# Patient Record
Sex: Female | Born: 1950 | Race: White | Hispanic: No | Marital: Single | Smoking: Former smoker
Health system: Southern US, Community
[De-identification: ages and names within clinical notes are randomized; demographics above are authoritative.]

## PROBLEM LIST (undated history)

## (undated) DIAGNOSIS — S81801A Unspecified open wound, right lower leg, initial encounter: Secondary | ICD-10-CM

## (undated) DIAGNOSIS — M1990 Unspecified osteoarthritis, unspecified site: Secondary | ICD-10-CM

## (undated) DIAGNOSIS — F32A Depression, unspecified: Secondary | ICD-10-CM

## (undated) DIAGNOSIS — I499 Cardiac arrhythmia, unspecified: Secondary | ICD-10-CM

## (undated) DIAGNOSIS — Z9889 Other specified postprocedural states: Secondary | ICD-10-CM

## (undated) DIAGNOSIS — F329 Major depressive disorder, single episode, unspecified: Secondary | ICD-10-CM

## (undated) DIAGNOSIS — F319 Bipolar disorder, unspecified: Secondary | ICD-10-CM

## (undated) DIAGNOSIS — I10 Essential (primary) hypertension: Secondary | ICD-10-CM

## (undated) DIAGNOSIS — I209 Angina pectoris, unspecified: Secondary | ICD-10-CM

## (undated) DIAGNOSIS — N189 Chronic kidney disease, unspecified: Secondary | ICD-10-CM

## (undated) DIAGNOSIS — M4807 Spinal stenosis, lumbosacral region: Secondary | ICD-10-CM

## (undated) DIAGNOSIS — K5790 Diverticulosis of intestine, part unspecified, without perforation or abscess without bleeding: Secondary | ICD-10-CM

## (undated) DIAGNOSIS — Z974 Presence of external hearing-aid: Secondary | ICD-10-CM

## (undated) DIAGNOSIS — M797 Fibromyalgia: Secondary | ICD-10-CM

## (undated) DIAGNOSIS — R011 Cardiac murmur, unspecified: Secondary | ICD-10-CM

## (undated) DIAGNOSIS — E7800 Pure hypercholesterolemia, unspecified: Secondary | ICD-10-CM

## (undated) DIAGNOSIS — C801 Malignant (primary) neoplasm, unspecified: Secondary | ICD-10-CM

## (undated) DIAGNOSIS — Z8614 Personal history of Methicillin resistant Staphylococcus aureus infection: Secondary | ICD-10-CM

## (undated) DIAGNOSIS — Z87440 Personal history of urinary (tract) infections: Secondary | ICD-10-CM

## (undated) HISTORY — PX: JOINT REPLACEMENT: SHX530

## (undated) HISTORY — PX: COLONOSCOPY: SHX174

## (undated) HISTORY — PX: TONSILLECTOMY: SUR1361

## (undated) HISTORY — PX: LASIK: SHX215

---
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
## 1987-07-04 HISTORY — PX: FINGER SURGERY: SHX640

## 1988-07-03 HISTORY — PX: RHINOPLASTY: SUR1284

## 1996-07-03 HISTORY — PX: ABLATION: SHX5711

## 2000-07-03 HISTORY — PX: REPLACEMENT TOTAL KNEE BILATERAL: SUR1225

## 2002-07-03 HISTORY — PX: CERVICAL LAMINECTOMY: SHX94

## 2006-12-11 ENCOUNTER — Emergency Department: Payer: Self-pay | Admitting: Emergency Medicine

## 2009-07-03 DIAGNOSIS — Z8614 Personal history of Methicillin resistant Staphylococcus aureus infection: Secondary | ICD-10-CM

## 2009-07-03 HISTORY — DX: Personal history of Methicillin resistant Staphylococcus aureus infection: Z86.14

## 2010-07-03 DIAGNOSIS — N189 Chronic kidney disease, unspecified: Secondary | ICD-10-CM

## 2010-07-03 HISTORY — DX: Chronic kidney disease, unspecified: N18.9

## 2010-11-01 DIAGNOSIS — Z9889 Other specified postprocedural states: Secondary | ICD-10-CM

## 2010-11-01 HISTORY — DX: Other specified postprocedural states: Z98.890

## 2010-11-01 HISTORY — PX: ROTATOR CUFF REPAIR: SHX139

## 2010-11-07 ENCOUNTER — Ambulatory Visit: Payer: Self-pay | Admitting: Specialist

## 2010-11-23 ENCOUNTER — Ambulatory Visit: Payer: Self-pay | Admitting: Specialist

## 2010-11-30 ENCOUNTER — Ambulatory Visit: Payer: Self-pay | Admitting: Specialist

## 2011-04-21 ENCOUNTER — Emergency Department: Payer: Self-pay | Admitting: Emergency Medicine

## 2011-04-24 ENCOUNTER — Inpatient Hospital Stay: Payer: Self-pay | Admitting: Internal Medicine

## 2011-05-14 ENCOUNTER — Other Ambulatory Visit: Payer: Self-pay | Admitting: Family Medicine

## 2011-09-06 ENCOUNTER — Inpatient Hospital Stay: Payer: Self-pay | Admitting: Internal Medicine

## 2011-09-06 LAB — URINALYSIS, COMPLETE
Bacteria: NONE SEEN
Blood: NEGATIVE
Glucose,UR: NEGATIVE mg/dL (ref 0–75)
Leukocyte Esterase: NEGATIVE
Nitrite: NEGATIVE
Protein: NEGATIVE
RBC,UR: NONE SEEN /HPF (ref 0–5)
Transitional Epi: <1
WBC UR: NONE SEEN /HPF (ref 0–5)

## 2011-09-06 LAB — CBC
MCHC: 34.1 g/dL (ref 32.0–36.0)
MCV: 86 fL (ref 80–100)
RBC: 3.3 10*6/uL — ABNORMAL LOW (ref 3.80–5.20)
RDW: 14.5 % (ref 11.5–14.5)
WBC: 6.6 10*3/uL (ref 3.6–11.0)

## 2011-09-06 LAB — APTT: Activated PTT: 31 secs (ref 23.6–35.9)

## 2011-09-06 LAB — COMPREHENSIVE METABOLIC PANEL
Albumin: 4 g/dL (ref 3.4–5.0)
Alkaline Phosphatase: 74 U/L (ref 50–136)
Anion Gap: 11 (ref 7–16)
Calcium, Total: 9.5 mg/dL (ref 8.5–10.1)
EGFR (Non-African Amer.): >60
Glucose: 202 mg/dL — ABNORMAL HIGH (ref 65–99)
SGOT(AST): 13 U/L — ABNORMAL LOW (ref 15–37)
SGPT (ALT): 20 U/L

## 2011-09-06 LAB — PROTIME-INR: Prothrombin Time: 13.2 secs (ref 11.5–14.7)

## 2011-09-07 LAB — BASIC METABOLIC PANEL
Anion Gap: 7 (ref 7–16)
Calcium, Total: 8.2 mg/dL — ABNORMAL LOW (ref 8.5–10.1)
Chloride: 113 mmol/L — ABNORMAL HIGH (ref 98–107)
Co2: 24 mmol/L (ref 21–32)
Creatinine: 1.05 mg/dL (ref 0.60–1.30)
Potassium: 3.8 mmol/L (ref 3.5–5.1)
Sodium: 144 mmol/L (ref 136–145)

## 2011-09-07 LAB — CBC WITH DIFFERENTIAL/PLATELET
Basophil #: 0 10*3/uL (ref 0.0–0.1)
Eosinophil %: 1.4 %
HCT: 23.1 % — ABNORMAL LOW (ref 35.0–47.0)
Lymphocyte %: 42.4 %
MCH: 28.8 pg (ref 26.0–34.0)
MCHC: 33.1 g/dL (ref 32.0–36.0)
MCV: 87 fL (ref 80–100)
Monocyte %: 6 %
Neutrophil #: 2.3 10*3/uL (ref 1.4–6.5)
RBC: 2.66 10*6/uL — ABNORMAL LOW (ref 3.80–5.20)
RDW: 15.1 % — ABNORMAL HIGH (ref 11.5–14.5)

## 2011-09-07 LAB — HEMOGLOBIN A1C: Hemoglobin A1C: 5.2 % (ref 4.2–6.3)

## 2011-09-07 LAB — HEMOGLOBIN
HGB: 7.8 g/dL — ABNORMAL LOW (ref 12.0–16.0)
HGB: 9 g/dL — ABNORMAL LOW (ref 12.0–16.0)

## 2011-10-29 ENCOUNTER — Emergency Department: Payer: Self-pay | Admitting: Emergency Medicine

## 2011-10-29 LAB — COMPREHENSIVE METABOLIC PANEL
Albumin: 4.3 g/dL (ref 3.4–5.0)
Anion Gap: 7 (ref 7–16)
Bilirubin,Total: 0.3 mg/dL (ref 0.2–1.0)
Chloride: 107 mmol/L (ref 98–107)
Creatinine: 1.23 mg/dL (ref 0.60–1.30)
EGFR (African American): 54 — ABNORMAL LOW
EGFR (Non-African Amer.): 47 — ABNORMAL LOW
Glucose: 100 mg/dL — ABNORMAL HIGH (ref 65–99)
Potassium: 4.1 mmol/L (ref 3.5–5.1)
SGOT(AST): 16 U/L (ref 15–37)
SGPT (ALT): 19 U/L

## 2011-10-29 LAB — CBC
MCH: 24.6 pg — ABNORMAL LOW (ref 26.0–34.0)
MCV: 77 fL — ABNORMAL LOW (ref 80–100)
Platelet: 324 10*3/uL (ref 150–440)
RBC: 4.37 10*6/uL (ref 3.80–5.20)
RDW: 18.9 % — ABNORMAL HIGH (ref 11.5–14.5)

## 2012-06-06 ENCOUNTER — Ambulatory Visit: Payer: Self-pay | Admitting: Gastroenterology

## 2012-07-03 HISTORY — PX: GASTRIC BYPASS: SHX52

## 2012-10-28 HISTORY — PX: ESOPHAGOGASTRODUODENOSCOPY: SHX1529

## 2013-01-22 IMAGING — US US RENAL KIDNEY
1 series · 18 of 25 positions shown · non-contrast
Comparison: none

REASON FOR EXAM: renal failure
COMMENTS:

PROCEDURE:     US  - US KIDNEY  - April 24, 2011  [DATE]
RESULT:     Right kidney measures 10.9 cm in maximum diameter, the left
cm. Cortex appears normal. No hydronephrosis. Bilateral renal flow present.
Foley in bladder.

[Series 1: us renal kidney · 18 of 25 slices shown]
[im 1/25]
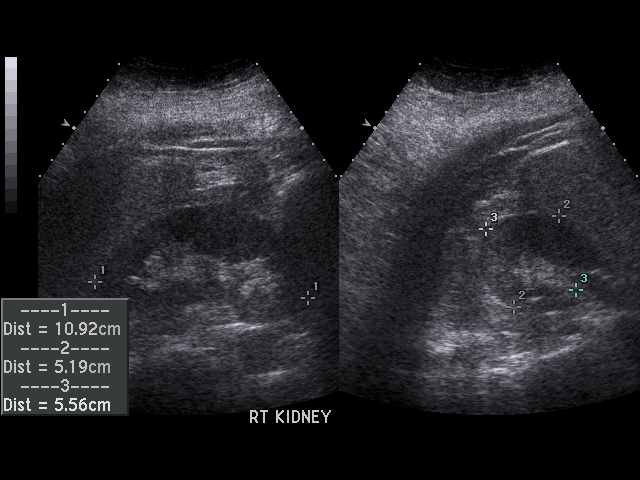
[im 3/25]
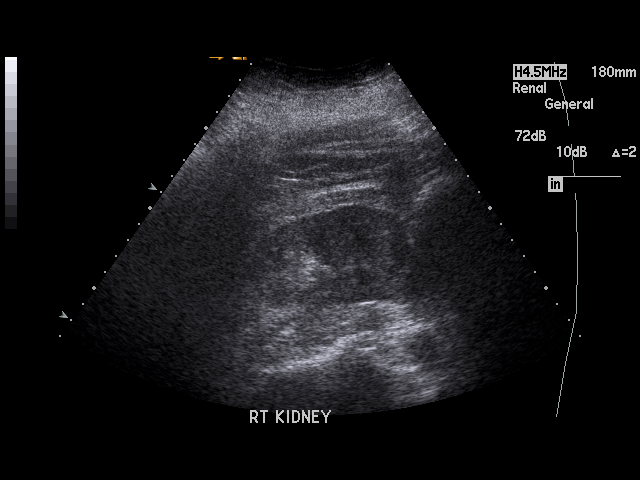
[im 4/25]
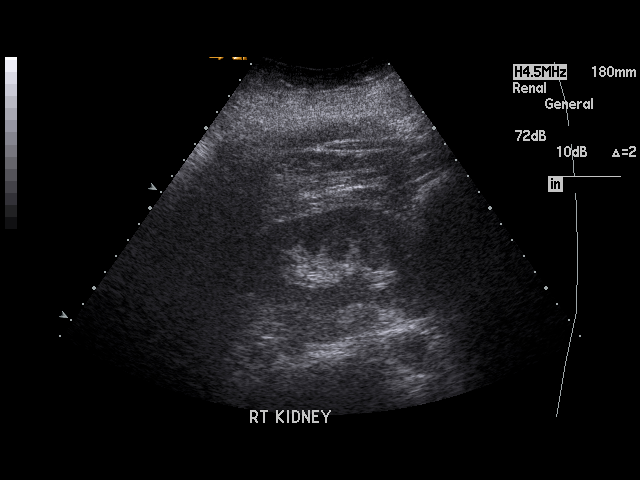
[im 5/25]
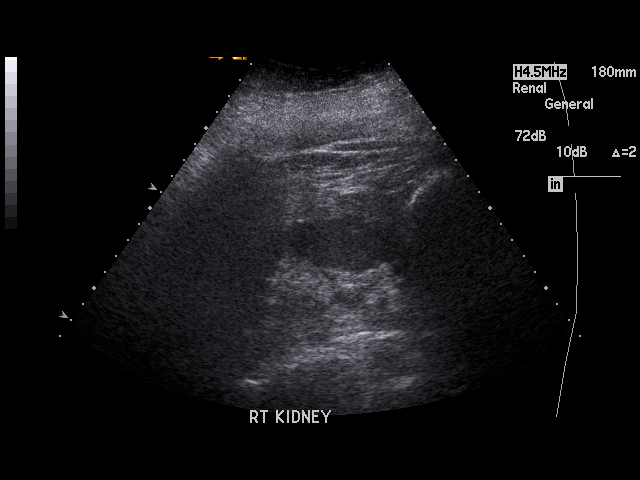
[im 7/25]
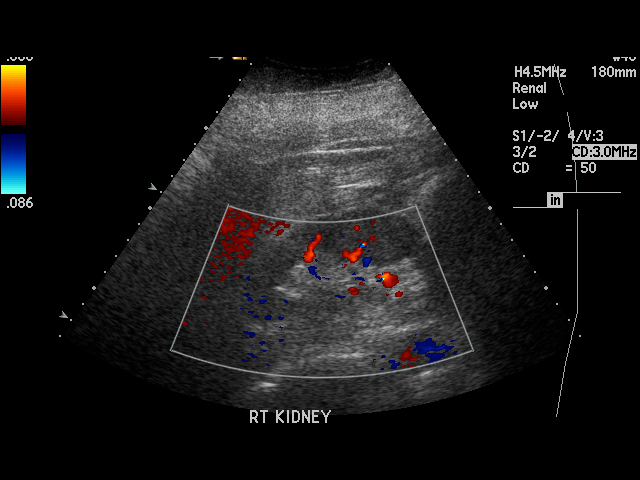
[im 8/25]
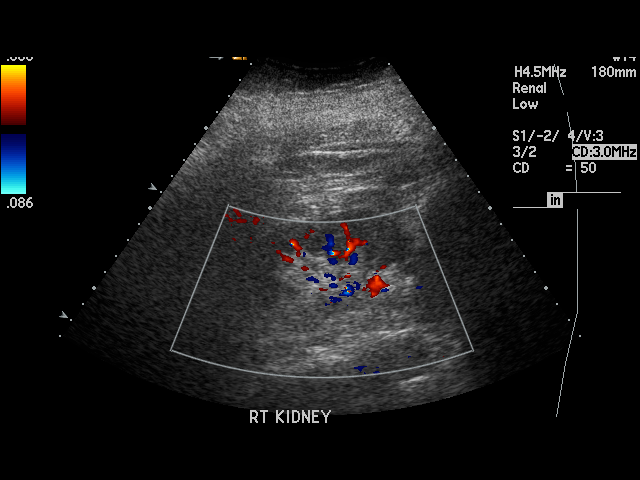
[im 10/25]
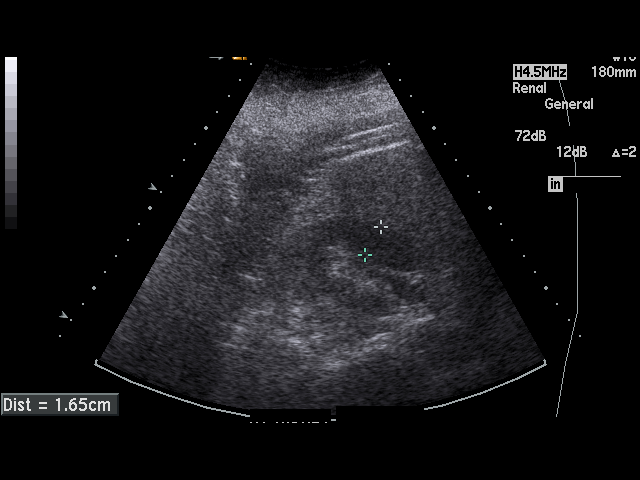
[im 11/25]
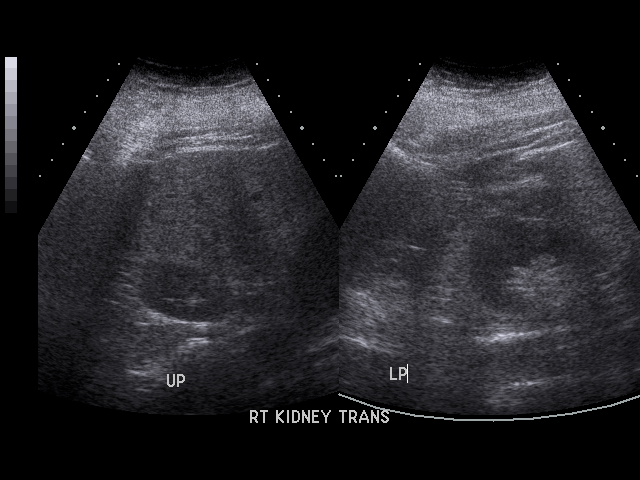
[im 12/25]
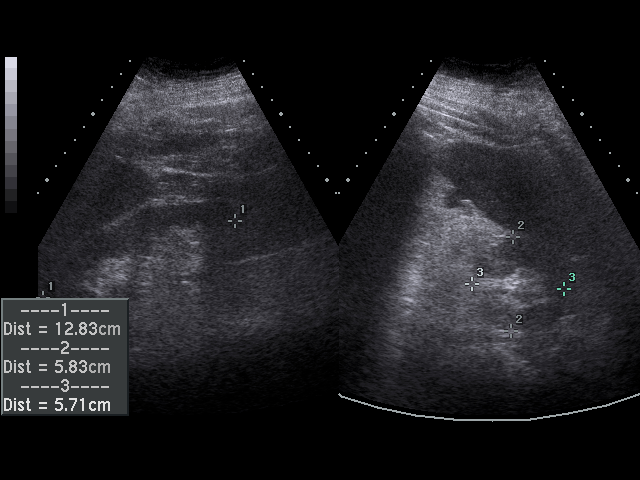
[im 14/25]
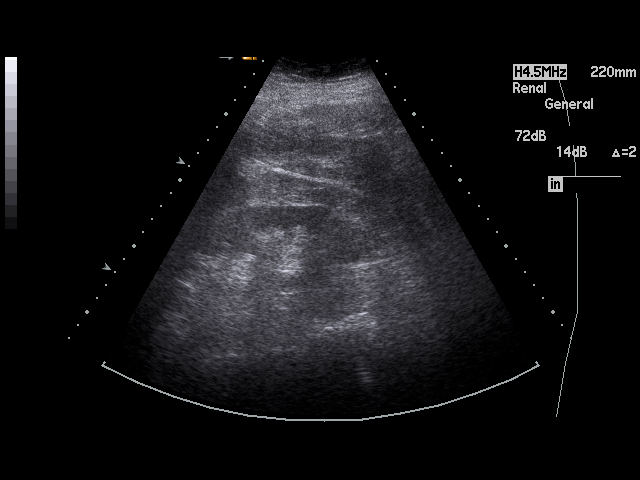
[im 15/25]
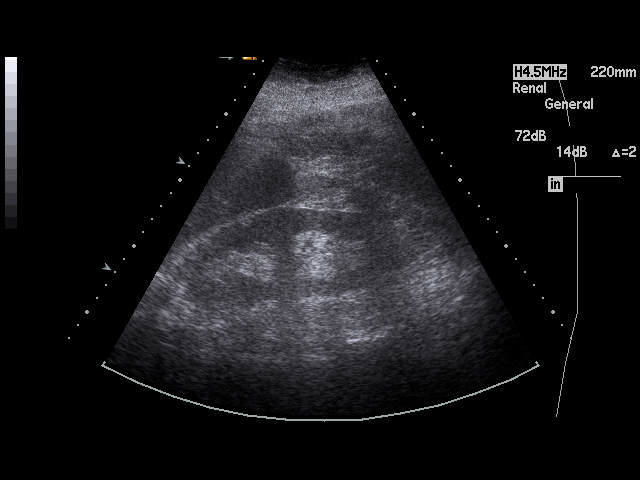
[im 16/25]
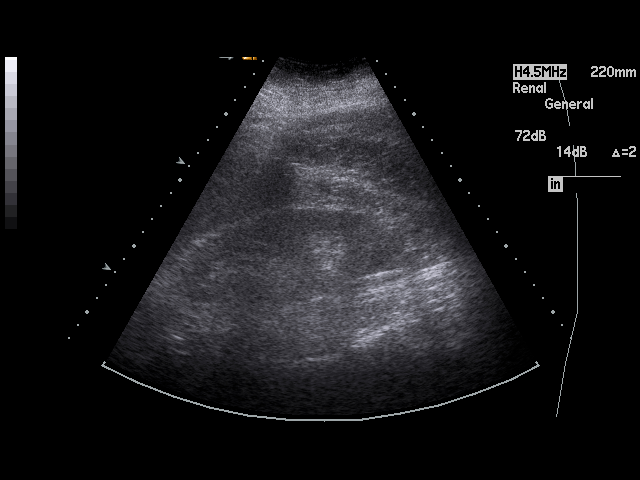
[im 18/25]
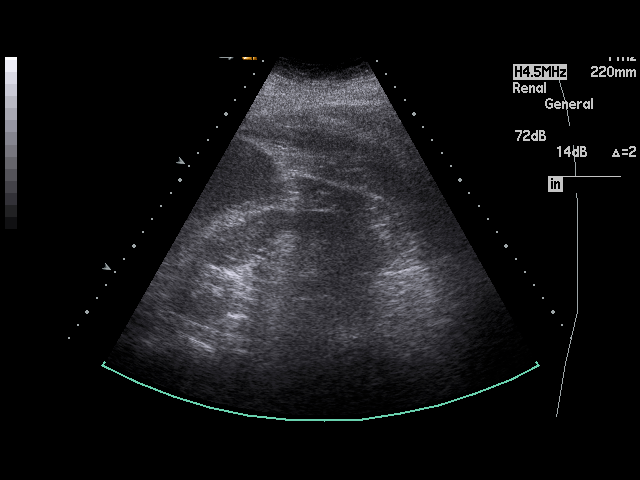
[im 19/25]
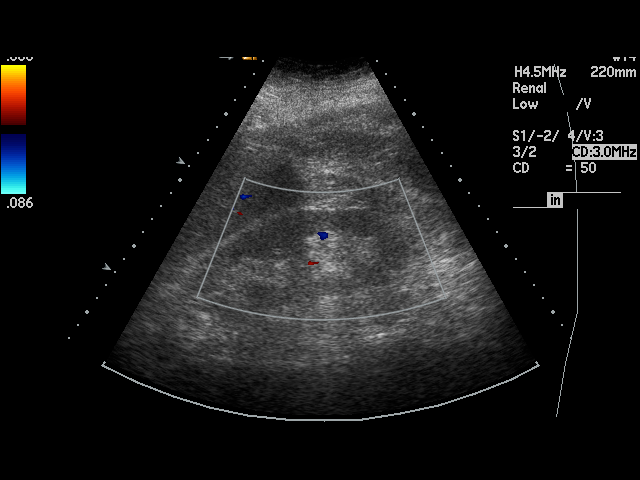
[im 21/25]
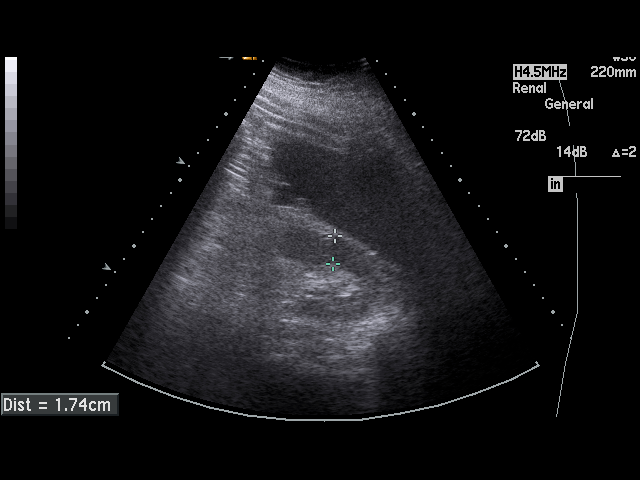
[im 22/25]
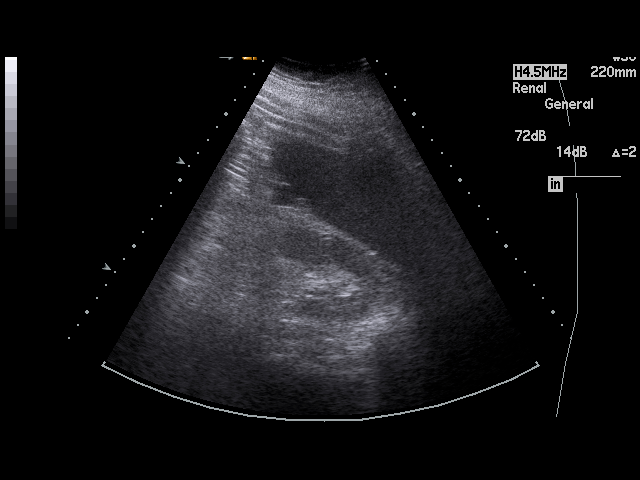
[im 23/25]
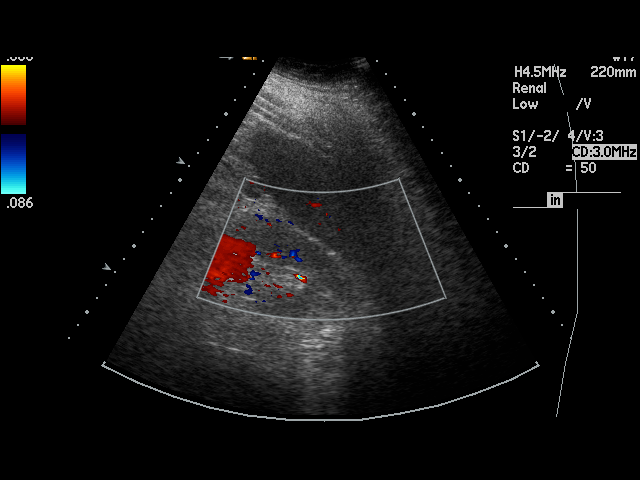
[im 25/25]
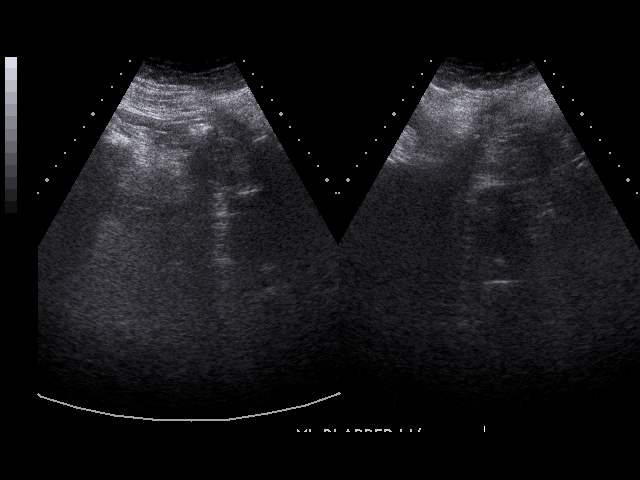

[18 of 25 positions shown; findings below may reference images not displayed]

IMPRESSION: Negative exam. Foley in bladder. But bladder decompressed.

## 2013-01-22 IMAGING — CR DG CHEST 1V PORT
1 series · 1 of 1 positions shown · non-contrast
Comparison: none

REASON FOR EXAM: ALOC
COMMENTS:

[view not recorded]
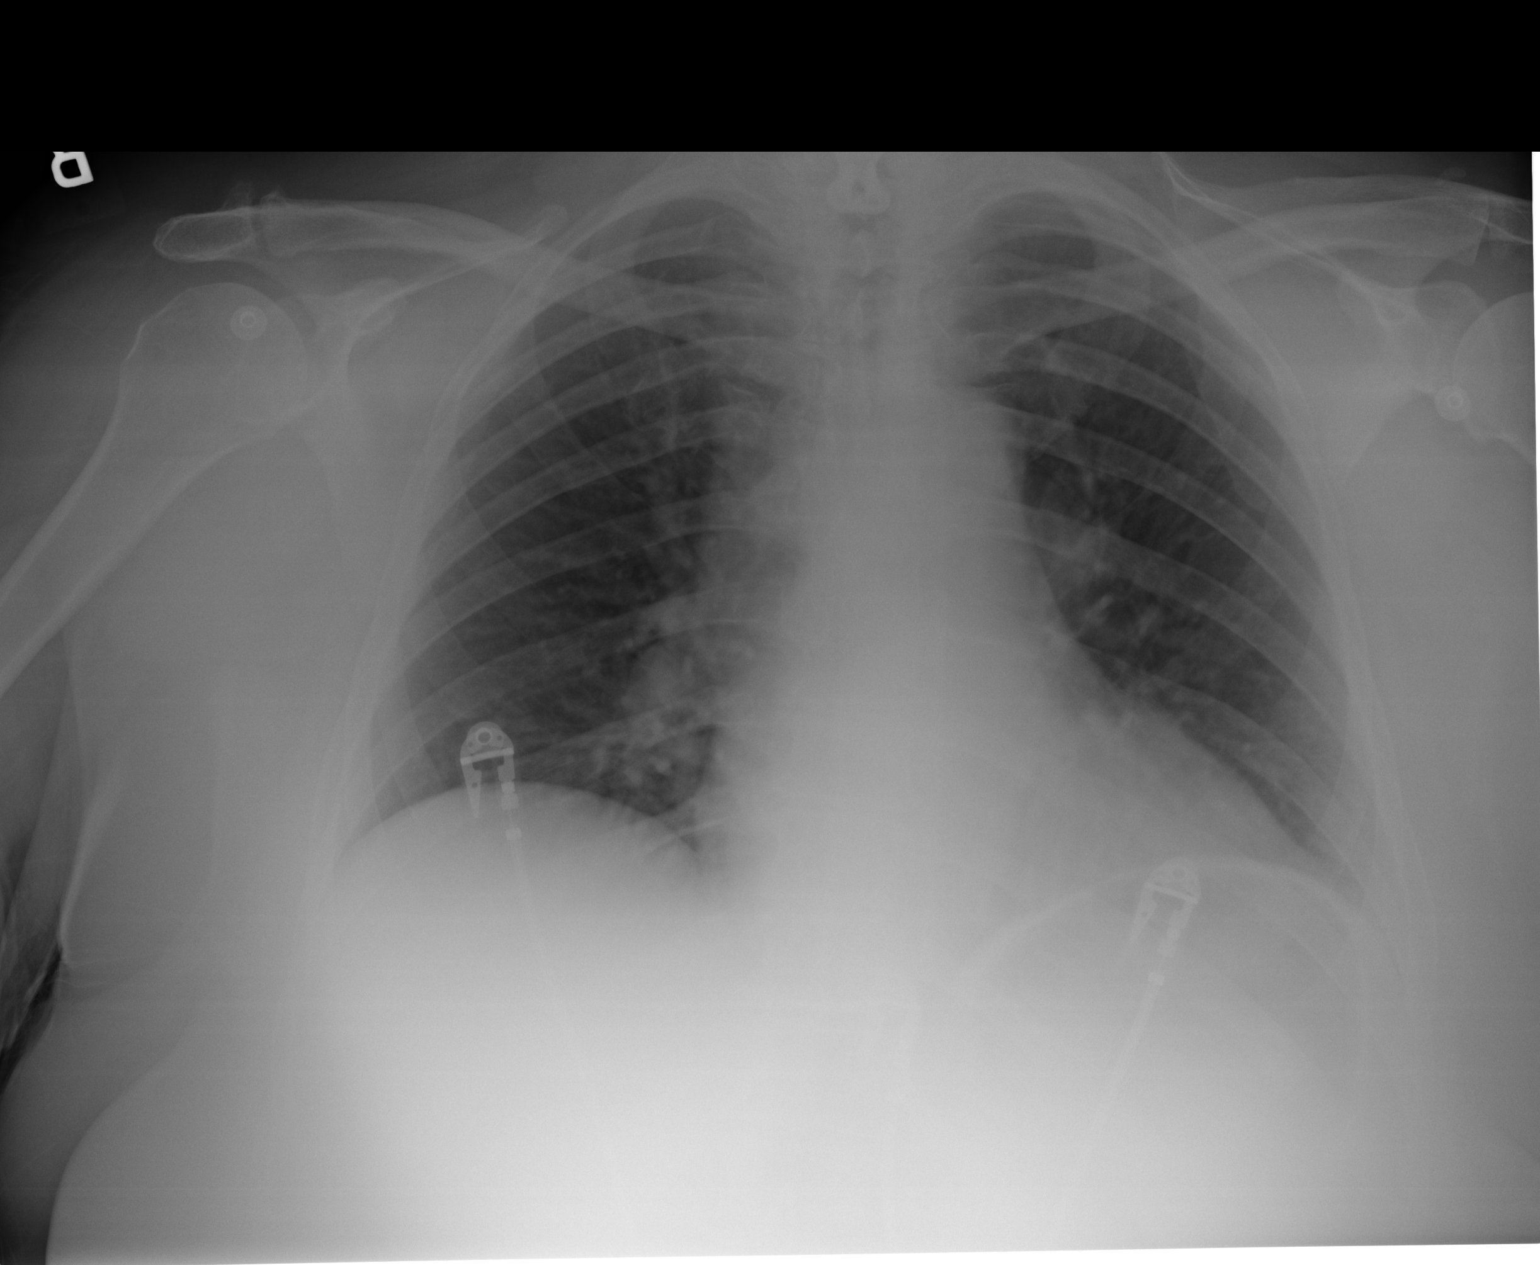

[1 of 1 positions shown; findings below may reference images not displayed]

PROCEDURE:     DXR - DXR PORTABLE CHEST SINGLE VIEW  - April 24, 2011  [DATE]

RESULT:     Comparison is made with study of 04/21/2011.

Very shallow inspiratory effort. The lungs are clear. The heart and
pulmonary vessels are normal. The bony and mediastinal structures are
unremarkable. There is no effusion. There is no pneumothorax or evidence of
congestive failure.
IMPRESSION: No acute cardiopulmonary disease.

## 2013-02-26 ENCOUNTER — Emergency Department: Payer: Self-pay | Admitting: Emergency Medicine

## 2013-04-01 ENCOUNTER — Ambulatory Visit: Payer: Self-pay | Admitting: Family Medicine

## 2013-05-09 ENCOUNTER — Ambulatory Visit: Payer: Self-pay | Admitting: Specialist

## 2013-05-14 ENCOUNTER — Ambulatory Visit: Payer: Self-pay | Admitting: Specialist

## 2013-09-10 ENCOUNTER — Emergency Department: Payer: Self-pay

## 2013-10-07 ENCOUNTER — Emergency Department: Payer: Self-pay | Admitting: Emergency Medicine

## 2014-10-20 NOTE — Consult Note (Addendum)
PATIENT NAME:  [name], [name] MR#:  [id number] DATE OF BIRTH:  05/30/1951  DATE OF CONSULTATION:  04/25/2011  REFERRING PHYSICIAN:  Theodoro Grist, MD CONSULTING PHYSICIAN:  Murlean Iba, MD  PRIMARY CARE PHYSICIAN: [facility].   HISTORY OF PRESENT ILLNESS: The patient is a 64 year old Caucasian female with a history of chronic pain syndrome, fibromyalgia, hyperlipidemia and hypertension. History is obtained from the chart as well as from the patient's friend. Her friend's name is [place]. [name] reports that the patient was brought to the Emergency Room on 10/19, which was Friday, for falling. She reported dark-colored urine, almost orange-colored. It was thick. The patient was off balance and could not even get to the bathroom. She was using a bedside commode. She also reports that the patient started to have back pain on Wednesday. She lives alone and has dogs at home. On Friday in the Emergency Room, she was diagnosed with urinary tract infection and given Cipro and discharged to home. The patient took the medication on Saturday morning. She was hungry and ate lunch and was talking and felt better. She asked her friend to go home and she was able to go the bathroom at that time and took a shower. Early Saturday morning, Friday night she posted on Facebook. On Sunday morning her friend could not get in touch with her because the patient was sleepy. She asked a neighbor to check on her who stated that things seemed to be all right. When her friend got there on Sunday afternoon, she saw her sleeping on the floor. She was very thirsty and was wanting to drink. She was very weak and was dropping her glass and utensils. The patient has not taken any more antibiotic doses. She asked [name] to go home Sunday afternoon. On Monday morning, she was feeling very sick and was not responding. She had slurred speech and was confused earlier. She was then brought to the Emergency Room for further evaluation.    In the ER upon arrival, her temperature was 97.7, respirations 20, blood pressure was 104, which later dropped to 67. She was given fluid boluses and was started on dopamine. The patient has progressively gotten worse in the last 24 hours. Her urine output remains poor. It is recorded at 165 mL in the last 24 hours. She has received over six liters of IV fluids. Her serum creatinine is 8.07. Her baseline creatinine is 1.29 as noted on Friday, sodium 133. Her plasma ammonia level is less than 25. CK level is elevated at 2,643. Urine tox screen is positive for opiates. Urinalysis has 99 RBCs and 9,385 WBCs. She is being treated with IV fluids. She is on pressors in the form of dopamine. She has been given Rocephin IV. Nephrology consult is requested for further evaluation.   PAST MEDICAL HISTORY:  1. Recent urinary tract infection on 10/19. Culture positive for 100,000 gram-negative rods.  2. Rotator cuff syndrome, arthroscopy in May 2012. 3. Chronic pain syndrome. 4. Fibromyalgia. 5. Hyperlipidemia. 6. Hypertension. 7. Frequent falls. 8. Osteoarthritis. 9. Debridement in 2001 by Dr. 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  10. Left lower lip laceration. 11. History of neck pain.  12. She has a left knee scar consistent with left knee surgery.    ALLERGIES: Effexor and Wellbutrin.    CURRENT MEDICATIONS:  1. Dopamine. 2. Ceftriaxone 1 gram IV q.24h.  3. Heparin 5,000 units subcutaneous every 12 hours. 4. Narcan.  5. Nicotine. 6. Zofran.  7. Pantoprazole. 8. Phenergan   HOME MEDICATIONS:  1. Adderall. 2. Aspirin. 3. Calcium supplements. 4. Chondroitin. 5. Co-Q10. 6. Cymbalta. 7. Diphenhydramine. 8. Gabapentin. 9. Garlic. 10. Gemfibrozil. 11. Hydrochlorothiazide. 12. Lamotrigine. Caroline.  14. Lisinopril 20 mg daily. 15. Morphine. 16. Multivitamin. 17. Naprosyn. 18. Omega-3. 19. Polyunsaturated fatty acids.  20. Sudafed. 21. Vitamin  D3.   SOCIAL HISTORY: The patient is single. No children. She lives alone. She has a dog at home. Her friend, [name], has known her for over 67 years and is close with her. She smokes 1 pack in three days. Occasional alcohol use. She used to work as an Engineer, water. She occasionally smokes marijuana. Her friend did find marijuana pipes in her home on Saturday evening and Sunday morning indicating recent use.   FAMILY HISTORY: Father lived until age 33. Brother died of tumor around IVC. Mother was 50 when she passed.   REVIEW OF SYSTEMS: Not available. All information is noted in history of present illness.   PHYSICAL EXAMINATION:  VITAL SIGNS: Temperature 98, pulse 76, blood pressure 110/81, 30% Ventimask.   GENERAL APPEARANCE: A critically ill-appearing female lying in the bed in no acute distress.   HEENT: Pupils are round, sluggish reaction to light. She has some conjunctival edema. No scleral icterus. Oropharynx: Mouth appears dry.   NECK: Supple. No JVD. No masses.   CHEST: Clear anteriorly and laterally. Exam is limited by body habitus.   CARDIOVASCULAR: Regular rate and rhythm, normal sinus on the telemetry. No murmurs. No rubs. No gallops.   ABDOMEN: Soft, nontender. Bowel sounds are present. There is a Foley catheter present which has whitish debris.   EXTREMITIES: No peripheral edema.   SKIN: There is evidence of multiple bruises on the feet.   NEUROLOGIC: She is lethargic. She does follow simple commands such as squeezing the hands and wiggling the toes. She has jerking movements of her extremities off and on.   MUSCULOSKELETAL: There is no acute joint swelling or effusions.   LABORATORY, RADIOLOGICAL AND DIAGNOSTIC DATA: BUN 65, creatinine 0.35, sodium 131, potassium 3.9, calcium 7.4, albumin 2.4. CK is 2,533. Urine drug screen positive for urine opiates, negative for cocaine. Hemoglobin 10.4. White count 12.7. Blood cultures are negative to date. Urine  cultures: Greater than 100,000 gram-negative rods. Urinalysis with 30 mg/dL protein, RBC 99, WBC 9,385, pH 7.29, pCO2 52. Renal ultrasound: Right kidney 10.9 cm, left kidney 12.8 cm. Cortex appears normal. No hydronephrosis. Foley present.   ASSESSMENT AND PLAN: A 64 year old Caucasian female with a history of hypertension, depression, hyperlipidemia, chronic pain and osteoarthritis, acute renal failure. Baseline creatinine is 1.26 on 10/19, likely secondary to sepsis and hypotension, likely source is a urinary tract infection as urine culture report is positive for gram-negative rods.   PLAN:  1. Due to acute mental status changes and neurotoxicity to which medications may also be contributing, we would like to get her dialyzed urgently to rule out uremic component. We will hold further IV fluids as urine output is very low and further normal saline may cause pulmonary edema. We will plan for continuous renal replacement therapy as the patient is on pressors. We will obtain vascular consult to place Vas-Cath. I have discussed this with Dr. Manuella Ghazi, who is the patient's attending physician. Also, discussed this case with the patient's nurse and the patient's friend, [name], who is at bedside. They are all in agreement with the above plan. We will also try to get in touch with her  cousin, who is driving down from [place]. If we are not able to get in touch with her, we will begin this treatment emergent and proceed with dialysis.  2. Anemia. Continue to monitor her hemoglobin.   3. Mild rhabdomyolysis as indicated by elevated CK levels. Her urine output is very poor and we are planning for dialysis, that should help with management of the rhabdomyolysis. We will continue to follow. The patient is critically ill and is at high risk of complications.   ____________________________ Murlean Iba, MD hs:ap D: 04/25/2011 09:31:11 ET T: 04/25/2011 11:00:59 ET JOB#: [id number]  cc: Murlean Iba, MD,  <Dictator> [place] [facility] MD ELECTRONICALLY SIGNED 07/08/2011 8:22

## 2014-10-25 NOTE — Consult Note (Signed)
PATIENT NAME:  [name], HANFORD MR#:  [id number] DATE OF BIRTH:  1951/06/23  DATE OF CONSULTATION:  09/07/2011  REFERRING PHYSICIAN:  Dr. Holley Raring CONSULTING PHYSICIAN:  Payton Emerald, NP/Paul Oh, MD  PRIMARY CARE PHYSICIAN:  Dr. Heber [place].   REASON FOR CONSULTATION: Melena, suspected upper gastrointestinal bleed.   HISTORY OF PRESENT ILLNESS: [name] is a 64 year old Caucasian female who had presented to [facility] Emergency Department yesterday for the chief complaint of, "My stool is very dark". The patient stated the onset of black, tarry stools, very malodorous, on Tuesday. She was experiencing 4 to 5 episodes of what appeared to be melena yesterday. She has been taking meloxicam 15 mg 1 tablet daily for the past three weeks for treatment of pain to shoulder. She has not been on a PPI therapy. Abdominal discomfort, more generalized. No nausea, no vomiting. The patient has been feeling fatigued. No evidence of bright red blood. No chills, shortness of breath or chest pain.   The patient underwent rectal examination in the Emergency Room Hemoccult revealed to be positive. BUN was elevated at 44 with a normal creatinine. Hemoglobin was 9.6 which was repeated today and noted decline to 7.7. Order has been placed for the patient to be typed and crossmatched and 2 units of packed red blood cells to be given.   MEDICATIONS:  1. Aspirin 81 mg a day.  2. Meloxicam 75 mg a day. 3. Latuda 40 mg at night. 4. Cymbalta 40 mg twice a day.  5. Lamotrigine 100 mg in the morning.  6. Gabapentin 600 mg twice a day. 7. Gemfibrozil 600 mg twice a day. 8. Lisinopril 20 mg a day. 9. Norvasc 10 mg a day. 10. Clonidine 0.2 mg twice a day. 11. Glucosamine/chondroitin 1,500 mg twice a day.  12. Multivitamin twice a day. 13. Fish oil 1,200 mg/omega-3, 360 mg a day.  14. Garlic 6468 mg a day. 15. Coenzyme Q10 100 mg a day.  16. Calcium.  17. Magnesium. 18. Zinc with vitamin D.   19. Vitamin D 2000 international units a day.   PAST MEDICAL HISTORY:  1. Hypertension.  2. Hyperlipidemia.  3. Chronic pain. 4. Fibromyalgia.  5. Osteoarthritis with degenerative disk disease and disk space narrowing L4-L5. 6. Frequent falls. 7. 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 Earnestine Leys. 8. History of lower lip laceration closure in 2008. 9. Neck pain. 10. Osteoarthritis. 11. Rotator cuff tear impingement surgery May 2012 performed arthroscopically. 12. Chronic pain syndrome. 59. Urinary tract infection 04/21/2011, prolonged hospitalization 10/22 to 11/02 for septic shock secondary to ESBL positive coli urinary tract infection with acute renal failure.  14. Hypokalemia. 15. Hypomagnesemia. 16. Hyponatremia. 17. Elevated LFTs. 18. Leukocytosis. 19. Metabolic encephalopathy.  20. Depression. 30. Former tobacco abuse.   PAST SURGICAL HISTORY:  1. Debridement of the right medial femoral condyle as well as the patellofemoral joint 2001. 2. Closure of lower lip laceration 2008. 3. Arthroscopic May 2010 rotator cuff tear impingement syndrome.     FAMILY HISTORY: Father relatively healthy, deceased at the age of 19. Brother deceased from liver cancer. Mother 1, deceased, unknown documented cause. No family history of coronary artery disease.   SOCIAL HISTORY: No tobacco use currently, smoked for many years. Alcohol, infrequent wine. No illicit drug use. Resides by herself. No children. Previously employed as a Engineer, site.   ALLERGIES: None.   REVIEW OF SYSTEMS: All 10  systems reviewed and checked and otherwise unremarkable other than what is stated above.   PHYSICAL EXAMINATION:  VITAL SIGNS: Temperature 97, pulse 63, respirations 20, blood pressure 127/72 with a pulse oximetry 97% on room air.   GENERAL: A well developed, well nourished 64 year old Caucasian  female, no acute distress noted. Pleasant.   HEENT: Normocephalic, atraumatic. Pupils equal and reactive to light. Conjunctivae clear. Sclerae anicteric.   NECK: Supple. Trachea midline. No lymphadenopathy or thyromegaly.   PULMONARY: Symmetric rise and fall of chest. Clear to auscultation throughout. No adventitious sounds.   CARDIOVASCULAR: Regular rhythm, S1, S2. No murmurs. No gallops.   ABDOMEN: Soft, nondistended. Discomfort noted epigastric. No rebound tenderness or guarding. No hepatosplenomegaly. No hernias. Hypoactive bowel sounds throughout.   EXTREMITIES: No edema. No clubbing or cyanosis.   RECTAL: Deferred.   MUSCULOSKELETAL: Moving all four extremities. No contractures. No clubbing.   NEUROLOGICAL: No gross neurological deficits.   LABORATORY, DIAGNOSTIC, AND RADIOLOGICAL DATA: Chemistry panel on admission: Glucose 202, BUN 44, otherwise within normal limits. Today's date: BUN remains elevated but improved at 31, chloride 113, non- African American EGFR 56, calcium declined from 9.5 to 8.2. Hemoglobin A1c 5.2. Hepatic panel on admission within normal limits except AST is low at 13, hemoglobin on admission 9.6, hematocrit 28.3, RBCs were 3.30. Trending of hemoglobin every eight hours, hemoglobin declined to 8.0, 7.7 and 7.8. PT was 13.2 with an INR 1.0, PTT 31.0. Urinalysis within normal limits.   IMPRESSION: 1. Melena, suspected upper gastrointestinal bleed, increased risk given the fact that the patient has been on NSAID use.  2. Anemia, probably secondary to upper gastrointestinal bleed.   PLAN:  1. The patient's presentation was discussed with Dr. Verdie Shire. Recommendation is to proceed with an upper endoscopy today to allow direct luminal evaluation of upper gastrointestinal tract to assess for evidence of gastritis as well as ulcer disease source of active blood loss. Procedure as well as risks versus benefits discussed with the patient. The patient is in agreement to  proceed forward in this manner.  2. The patient should remain on PPI therapy at this time. 3. Would hold Aspirin and NSAID use at this point.  4. Will continue to monitor laboratory evaluation and in agreement to transfuse as necessary.  These serviced provided by Payton Emerald, NP in collaboration with Verdie Shire, MD.     ____________________________ Payton Emerald, NP dsh:ap D: 09/07/2011 12:51:26 ET T: 09/07/2011 13:18:50 ET JOB#: [id number]  cc: Payton Emerald, NP, <Dictator> Payton Emerald MD ELECTRONICALLY SIGNED 09/07/2011 16:50

## 2014-10-25 NOTE — Consult Note (Signed)
See Dawn Harrison's notes. Pt with melena with drop in hgb with epig pain. On meloxicam and ASA. EGD showed mild esophagitis plus multiple gastric erosions. No active bleeding now but likely bled from stomach. Continue ppi bid. Start reg diet. Hold ASA and meloxicam. Thanks.  Electronic Signatures: Verdie Shire (MD)  (Signed on 07-Mar-13 11:44)  Authored  Last Updated: 07-Mar-13 11:44 by Verdie Shire (MD)

## 2014-10-25 NOTE — Discharge Summary (Signed)
PATIENT NAME:  [name], [name] MR#:  [id number] DATE OF BIRTH:  09-Nov-1950  DATE OF ADMISSION:  09/06/2011 DATE OF DISCHARGE:  09/08/2011  PRESENTING COMPLAINT: "My stool was really dark."  DISCHARGE DIAGNOSIS: Acute posthemorrhagic anemia due to upper GI bleed secondary to NSAID-induced gastric erosions.   PROCEDURE:  EGD by Dr. Candace Cruise showed gastric erosions.   MEDICATIONS: 1. Latuda  40 mg p.o. daily. 2. Clonidine 0.2 mg b.i.d.  3. Cymbalta 60 mg p.o. b.i.d.  4. Lamotrigine 100 mg p.o. daily.  5. Gabapentin 600 mg b.i.d.  6. Gemfibrozil 600 mg b.i.d.  7. Lisinopril 20 mg daily.  8. Chondroitin/glucosamine 1500 mg b.i.d.  9. Fish oil 1200 mg daily.   10. Garlic 1275 mg daily. 11. Co-Q 10/100-mg capsule daily.  12. Norvasc 10 mg daily.  13. Vitamin D3 2000 international units daily.  14. Multivitamin daily.  15. Calcium with magnesium and zinc daily.  16. Protonix 40 mg b.i.d. for two weeks, then daily.  17. Avoid NSAIDs, meloxicam, or other aspirin-like medicines until further advised.  FOLLOWUP:  Follow up with Dr. Heber [place] on 09/27/2011.  LABORATORY DATA: Hemoglobin at discharge was 9. One unit of blood transfusion was given. White count was 4.5, platelet count was 237. Chemistry within normal limits except BUN of 31, chloride of 113, calcium of 8.2. Urinalysis negative for urinary tract infection.   BRIEF SUMMARY OF HOSPITAL COURSE: [name] is a 64 year old Caucasian female with history of hypertension, hyperlipidemia, fibromyalgia, chronic pain, degenerative disc disease who presented with melena, dark colored emesis, epigastric pain .  She was admitted with:   1. Upper GI bleed secondary to NSAID-induced gastric erosions. Aspirin and meloxicam were held.  She was started on IV Protonix. She underwent EGD by Dr. Candace Cruise that did not show any active bleeding but did show a few gastric erosions.  The patient got 1 unit of blood transfusion. Her hemoglobin was stable, was at 9 prior to  discharge. She was tolerating a regular diet.  2. Acute posthemorrhagic anemia suspected secondary to acute GI blood loss. Transfused 1 unit. Hemoglobin remained stable.  3. Hypertension. She was continued on her home medications.  4. Chronic pain medications. The patient advised to avoid NSAIDs. She was told to take Tylenol as needed.  5. Depression. Continue home medications.  6. Hospital stay otherwise remained stable.  7. CODE STATUS: The patient remained a FULL CODE.   TIME SPENT: 40 minutes.   ____________________________ [place] Posey Pronto, MD sap:bjt D: 09/10/2011 12:22:58 ET T: 09/10/2011 12:42:59 ET JOB#: [id number]  cc: Jordyn Doane A. Posey Pronto, MD, <Dictator> Maureen P. Heber [place], MD Ilda Basset MD ELECTRONICALLY SIGNED 09/14/2011 16:04

## 2014-10-25 NOTE — Consult Note (Signed)
Brief Consult Note: Diagnosis: Melena, Upper GI Bleed.  Recent NSAID use and ASA use.  Abdominal pain to epigastric.   Consult note dictated.   Discussed with Attending MD.   Comments: Patient's presentation discussed with Dr. Candace Cruise and recommendation is to proceed with EGD today to allow direct luminal evalaution of upper GI tract to assess for source of active blood loss.  Continue PPI therapy.  Serial monitoring of H/H and trasfuse as necessary.  Electronic Signatures: Payton Emerald (NP)  (Signed 07-Mar-13 12:54)  Authored: Brief Consult Note   Last Updated: 07-Mar-13 12:54 by Payton Emerald (NP)

## 2014-10-25 NOTE — H&P (Signed)
PATIENT NAME:  [name], [name] MR#:  [id number] DATE OF BIRTH:  1950/07/05  DATE OF ADMISSION:  09/06/2011   REFERRING PHYSICIAN: Dr. Robet Leu   PRIMARY CARE PHYSICIAN: Dr. Heber Bascom   PRIMARY ORTHOPEDIST: Dr. Earnestine Leys    CHIEF COMPLAINT: "My stool was really dark".  HISTORY OF PRESENT ILLNESS: The patient is a pleasant 64 year old female with past medical history as listed below who presents to the Emergency Department with the above-mentioned chief complaint. The patient states that yesterday she noticed dark black stool which was very malodorous and, according to her, smelled like digested blood and again she had 4 to 5 episodes of what appears to be melena today. She also had one episode of dark brown-colored emesis. She reports some epigastric pain and also some abdominal pain in the periumbilical region without radiation. She reports frequent NSAID use and has been using 15 mg of meloxicam daily over the past two weeks for back pain. She has been also using low dose aspirin daily as well. Other than the above-mentioned complaint, she is without any specific complaint at this time. She denies any dizziness, lightheadedness, weakness, fatigue, malaise, or shortness of breath. She came to the ER and was noted to have strongly Hemoccult-positive stools. BUN is elevated at 44 with normal creatinine. Hemoglobin is down to 9.6. There is a concern for GI bleed and, thereafter, hospitalist services were contacted for further evaluation and for hospital admission.   PAST SURGICAL HISTORY:  1. Debridement of right medial femoral condyle as well as patellofemoral joint in 2001 by Dr. Earnestine Leys. 2. Closure of lower lip laceration in 2008 by ENT. 3. Arthroscopy May of 2012 for rotator cuff tear impingement syndrome.   PAST MEDICAL HISTORY:  1. Hypertension. 2. Hyperlipidemia. 3. Chronic pain. 4. Fibromyalgia.  5. Osteoarthritis and degenerative disk disease with disk space narrowing at L4-L5 per  the patient. 6. Frequent falls. 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 Earnestine Leys. 8. History of lower lip laceration status post closure in 2008 by ENT.  9. History of neck pain.  10. History of osteoporosis. 11. History of rotator cuff tear impingement syndrome May of 2012 noted on arthroscopy.  12. Chronic pain syndrome.  13. Urinary tract infection diagnosed 04/21/2011. 14. Prolonged hospitalization 10/22 to 05/05/2011 for septic shock secondary to ESBL positive Escherichia coli urinary tract infection with acute respiratory, acute renal failure, hypokalemia, hypomagnesemia, hyponatremia, elevated liver function tests, leukocytosis, metabolic encephalopathy.  15. History of depression.  3. Former tobacco abuse.  ALLERGIES: No known drug allergies.   HOME MEDICATIONS:  1. Aspirin 81 mg daily.  2. Meloxicam 15 mg daily.  3. Latuda 40 mg q.p.m.  4. Cymbalta 60 mg b.i.d.  5. Lamotrigine 100 mg q.a.m.  6. Gabapentin 600 mg b.i.d.  7. Gemfibrozil 600 mg b.i.d.  8. Lisinopril 20 mg daily.  9. Norvasc 10 mg daily.  10. Clonidine 0.2 mg b.i.d.  11. Glucosamine/chondroitin 1500 mg b.i.d.  12. Multivitamin 1 b.i.d.  13. Fish Oil 1200 mg/Omega-3 360 mg daily.  14. Garlic 6606 mg daily.  15. Co-Enzyme-Q10 100 mg daily. 16. Calcium, magnesium, zinc with Vitamin D3 daily.  17. Vitamin D 2000 international units daily.   FAMILY HISTORY: Father relatively healthy, died at age 55. Brother died of liver cancer. Mother was 65 when she died. No family history of coronary artery disease.    SOCIAL HISTORY: Tobacco none currently.  She used to smoke in the past a couple of cigarettes a day for many years. Alcohol infrequent occasional wine. Illicit drugs none. The patient lives in [place], [place] at home by herself. She has no children. She works as a Engineer, site.   REVIEW  OF SYSTEMS: CONSTITUTIONAL: Denies fevers, chills, recent changes in weight or weakness. Has mild abdominal pain. HEAD/EYES: Denies headache or blurry vision. ENT: Denies tinnitus, earache, nasal discharge, or sore throat. RESPIRATORY: Denies shortness breath, cough, or wheezing. CARDIOVASCULAR: Denies chest pain, heart palpitations, or lower extremity edema. GI: Has had some nausea, dark-colored, vomiting, abdominal pain, and melena. Denies hematochezia. No diarrhea or constipation. GU: Denies dysuria or hematuria. ENDOCRINE: Denies heat or cold intolerance. HEME/LYMPH: Denies easy bruising. Has black-colored stool but not bright red and dark-colored vomitus. INTEGUMENTARY: Denies rash or lesions. MUSCULOSKELETAL: Has chronic pain which is currently well controlled with her pain medications; chronic back pain as well which has been stable and has not recently worsened. Denies muscle weakness. NEUROLOGIC: Denies headache, numbness, weakness, tingling, or dysarthria. PSYCHIATRIC: Has underlying depression. Denies anxiety at this time.   PHYSICAL EXAMINATION:   VITAL SIGNS: Temperature 98.3, pulse 76, blood pressure 158/77, respirations 18, oxygen sat 98% on room air.   GENERAL: Pleasant female alert and oriented not acutely distressed.   HEENT: Normocephalic, atraumatic. Pupils equal, round, and reactive to light and accommodation. Extraocular muscles are intact. Anicteric sclerae. Conjunctivae pink. Hearing intact to voice. Nares without drainage. Oral mucosa moist without lesions.   NECK: Supple with full range of motion. No JVD, lymphadenopathy, or carotid bruits bilaterally. No thyromegaly or tenderness to palpation over the thyroid gland.   CHEST: Normal respiratory effort without use of accessory respiratory muscles. Lungs clear to auscultation bilaterally without crackles, rales, or wheezes.   CARDIOVASCULAR: S1, S2 positive. Regular rate and rhythm. No murmurs, rubs, or gallops. PMI is  non-lateralized.   ABDOMEN: Soft, nondistended. She has some epigastric tenderness to palpation without rebound or guarding. No hepatosplenomegaly or palpable masses. No hernias. Hypoactive bowel sounds throughout.   EXTREMITIES: No clubbing, cyanosis, or edema. Pedal pulses are palpable bilaterally.   SKIN: No suspicious rashes. Skin turgor is good. Skin is warm to the touch.   LYMPH: No cervical lymphadenopathy.   NEUROLOGIC: Alert and oriented x3. Cranial nerves II through XII are grossly intact. No focal deficits.  PSYCH: Pleasant female with appropriate affect.  LABORATORY, DIAGNOSTIC, AND RADIOLOGICAL DATA: CMP within normal limits except for BUN 44 and serum glucose 202. CBC WBC 6.6, hemoglobin 9.6, hematocrit 28.3, platelets 318, MCV 86. INR 1.0. EKG normal sinus rhythm, 72 beats per minute, normal axis, normal intervals. No acute ST or T wave changes.    ASSESSMENT AND PLAN: This is a 64 year old female with of hypertension, hyperlipidemia, fibromyalgia, chronic pain syndrome, degenerative disk disease, osteoarthritis, and depression here with melena, dark-colored emesis, epigastric pain, on daily NSAIDs with:  1. Suspected upper GI bleed, suspect NSAID-induced upper GI bleed. Will admit patient to Med/Surg unit. Hold meloxicam and aspirin for now. Start patient on Protonix drip. Start IV fluids for volume resuscitation purposes. Will obtain serial hemoglobin and hematocrit checks and transfuse patient as needed. Obtain Gastroenterology consultation to arrange endoscopy and for further recommendations. Further work-up and management to follow depending on the patient's clinical course. Keep her on clear liquid diet for now and n.p.o. after midnight in the event that she undergoes endoscopy tomorrow.  2. Acute posthemorrhagic anemia suspect secondary to acute GI blood loss. Check  iron panel and ferritin level. Serial hemoglobin and hematocrit checks as above and transfuse patient as needed.  She currently denies any weakness, fatigue, malaise, shortness of breath, and her anemia appears to be asymptomatic at this time. Obtain Gastroenterology consultation for further recommendations. I suspect her anemia is secondary to GI bleed.  3. Hyperglycemia. Will test further for diabetes mellitus by checking fasting serum blood glucose level and also hemoglobin A1c and if she is noted to be diabetic will obtain diabetic teaching and can likely start her on pills for diabetic management.   4. Hypertension. Continue lisinopril, clonidine, and Norvasc and monitor blood pressure closely.  5. Hyperlipidemia. Check fasting lipid profile in a.m. Continue gemfibrozil and Fish Oil for now.  6. Chronic pain syndrome. Avoid NSAIDs given suspected GI bleed. Keep her on p.r.n. morphine and p.r.n. Norco as well as p.r.n. Tylenol and continue her Neurontin as before.  7. Fibromyalgia. Pain control as above.  8. Osteoporosis/degenerative disk disease. Pain control as above. Avoid NSAIDs. 9. Depression. Continue Latuda, Cymbalta, and Lamotrigine.  10. DVT prophylaxis. SCDs and TEDs.   CODE STATUS: FULL CODE.   TIME SPENT ON ADMISSION: Approximately 60 minutes.    Plan of care was discussed with the patient in great detail in the ER who is currently in agreement with all the above.   ____________________________ Romie Jumper, MD knl:drc D: 09/06/2011 19:51:00 ET T: 09/07/2011 06:49:20 ET JOB#: [id number]  cc: Romie Jumper, MD, <Dictator> Maureen P. Heber [place], MD Romie Jumper MD ELECTRONICALLY SIGNED 09/13/2011 18:18

## 2015-07-04 HISTORY — PX: TRIGGER FINGER RELEASE: SHX641

## 2016-10-24 ENCOUNTER — Other Ambulatory Visit: Payer: Self-pay | Admitting: Specialist

## 2016-10-24 MED ORDER — MELOXICAM 7.5 MG PO TABS: 15.0000 mg | ORAL_TABLET | Freq: Once | ORAL | Status: AC

## 2016-10-31 ENCOUNTER — Encounter
Admission: RE | Admit: 2016-10-31 | Discharge: 2016-10-31 | Disposition: A | Discharge status: Home / Self Care | Payer: Medicare PPO | Source: Ambulatory Visit | Attending: Specialist | Admitting: Specialist

## 2016-10-31 ENCOUNTER — Encounter: Payer: Self-pay | Admitting: *Deleted

## 2016-10-31 DIAGNOSIS — I440 Atrioventricular block, first degree: Secondary | ICD-10-CM | POA: Diagnosis not present

## 2016-10-31 DIAGNOSIS — R001 Bradycardia, unspecified: Secondary | ICD-10-CM | POA: Diagnosis not present

## 2016-10-31 DIAGNOSIS — Z01810 Encounter for preprocedural cardiovascular examination: Secondary | ICD-10-CM | POA: Insufficient documentation

## 2016-10-31 DIAGNOSIS — R9431 Abnormal electrocardiogram [ECG] [EKG]: Secondary | ICD-10-CM | POA: Insufficient documentation

## 2016-10-31 DIAGNOSIS — I10 Essential (primary) hypertension: Secondary | ICD-10-CM | POA: Insufficient documentation

## 2016-10-31 DIAGNOSIS — Z01818 Encounter for other preprocedural examination: Secondary | ICD-10-CM | POA: Diagnosis present

## 2016-10-31 DIAGNOSIS — Z01812 Encounter for preprocedural laboratory examination: Secondary | ICD-10-CM | POA: Diagnosis not present

## 2016-10-31 HISTORY — DX: Depression, unspecified: F32.A

## 2016-10-31 HISTORY — DX: Fibromyalgia: M79.7

## 2016-10-31 HISTORY — DX: Unspecified osteoarthritis, unspecified site: M19.90

## 2016-10-31 HISTORY — DX: Personal history of urinary (tract) infections: Z87.440

## 2016-10-31 HISTORY — DX: Other specified postprocedural states: Z98.890

## 2016-10-31 HISTORY — DX: Major depressive disorder, single episode, unspecified: F32.9

## 2016-10-31 HISTORY — DX: Bipolar disorder, unspecified: F31.9

## 2016-10-31 HISTORY — DX: Pure hypercholesterolemia, unspecified: E78.00

## 2016-10-31 HISTORY — DX: Essential (primary) hypertension: I10

## 2016-10-31 LAB — BASIC METABOLIC PANEL
Anion gap: 10 (ref 5–15)
BUN: 19 mg/dL (ref 6–20)
CALCIUM: 9.3 mg/dL (ref 8.9–10.3)
CO2: 29 mmol/L (ref 22–32)
CREATININE: 0.89 mg/dL (ref 0.44–1.00)
Chloride: 102 mmol/L (ref 101–111)
GFR calc Af Amer: >60 mL/min (ref >60)
GLUCOSE: 96 mg/dL (ref 65–99)
Potassium: 3.7 mmol/L (ref 3.5–5.1)
SODIUM: 141 mmol/L (ref 135–145)

## 2016-10-31 LAB — CBC
HEMATOCRIT: 41.7 % (ref 35.0–47.0)
Hemoglobin: 14.2 g/dL (ref 12.0–16.0)
MCH: 30 pg (ref 26.0–34.0)
MCHC: 34 g/dL (ref 32.0–36.0)
MCV: 88.1 fL (ref 80.0–100.0)
PLATELETS: 265 10*3/uL (ref 150–440)
RBC: 4.74 MIL/uL (ref 3.80–5.20)
RDW: 13.5 % (ref 11.5–14.5)
WBC: 4.2 10*3/uL (ref 3.6–11.0)

## 2016-10-31 NOTE — Patient Instructions (Signed)
  Your procedure is scheduled on: Wednesday, 11/08/2016 Report to Same Day Surgery 2nd floor medical mall [facility] Entrance-take elevator on left to 2nd floor.  Check in with surgery information desk.) To find out your arrival time please call [phone] between 1PM - 3PM on Tuesday, May 8  Remember: Instructions that are not followed completely may result in serious medical risk, up to and including death, or upon the discretion of your surgeon and anesthesiologist your surgery may need to be rescheduled.    _x___ 1. Do not eat food or drink liquids after midnight. No gum chewing or hard candies.      __x__ 2. No Alcohol for 24 hours before or after surgery.   __x__3. No Smoking for 24 prior to surgery.   ____  4. Bring all medications with you on the day of surgery if instructed.    __x__ 5. Notify your doctor if there is any change in your medical condition     (cold, fever, infections).     Do not wear jewelry, make-up, hairpins, clips or nail polish.  Do not wear lotions, powders, or perfumes. You may wear deodorant.  Do not shave 48 hours prior to surgery. Men may shave face and neck.  Do not bring valuables to the hospital.    [facility] is not responsible for any belongings or valuables.     Patients discharged the day of surgery will not be allowed to drive home.  You will need someone to drive you home and stay with you the night of your procedure.    Please read over the following fact sheets that you were given:   [facility] Preparing for Surgery and or MRSA Information   _x___ Take this medication the morning of surgery with a SIP of water:   1. AMLODIPINE  2. LAMICTAL    _x___ Use CHG Soap or sage wipes as directed on instruction sheet    _x___ Follow recommendations from Cardiologist, Pulmonologist or PCP regarding stopping Aspirin.  X____Stop Anti-inflammatories such as Advil, Aleve, Ibuprofen, Motrin, Naproxen, Naprosyn, Goodies powders or aspirin  products. OK to take Tylenol and Celebrex.   _x___ Stop supplements until after surgery.  (Finger; OSTEO BI-FLEX; FISH OIL)

## 2016-11-01 NOTE — Pre-Procedure Instructions (Signed)
EKG VIEWED BY DR KARENZ AND OK 

## 2016-11-02 NOTE — Pre-Procedure Instructions (Signed)
Furnas Hoy Morn 11/02/16

## 2016-11-07 MED ORDER — CLINDAMYCIN PHOSPHATE 600 MG/50ML IV SOLN
600.0000 mg | Freq: Once | INTRAVENOUS | Status: AC
  Administered 2016-11-08: 600 mg via INTRAVENOUS

## 2016-11-07 MED ORDER — CEFAZOLIN SODIUM-DEXTROSE 2-4 GM/100ML-% IV SOLN
2.0000 g | INTRAVENOUS | Status: AC
  Administered 2016-11-08: 2 g via INTRAVENOUS

## 2016-11-08 ENCOUNTER — Ambulatory Visit: Payer: Medicare PPO | Admitting: Anesthesiology

## 2016-11-08 ENCOUNTER — Ambulatory Visit
Admission: RE | Admit: 2016-11-08 | Discharge: 2016-11-08 | Disposition: A | Discharge status: Home / Self Care | Payer: Medicare PPO | Source: Ambulatory Visit | Attending: Specialist | Admitting: Specialist

## 2016-11-08 ENCOUNTER — Encounter
Admission: RE | Disposition: A | Discharge status: Home / Self Care | Payer: Self-pay | Source: Ambulatory Visit | Attending: Specialist

## 2016-11-08 DIAGNOSIS — I10 Essential (primary) hypertension: Secondary | ICD-10-CM | POA: Insufficient documentation

## 2016-11-08 DIAGNOSIS — F319 Bipolar disorder, unspecified: Secondary | ICD-10-CM | POA: Diagnosis not present

## 2016-11-08 DIAGNOSIS — M797 Fibromyalgia: Secondary | ICD-10-CM | POA: Diagnosis not present

## 2016-11-08 DIAGNOSIS — Z888 Allergy status to other drugs, medicaments and biological substances status: Secondary | ICD-10-CM | POA: Insufficient documentation

## 2016-11-08 DIAGNOSIS — M94212 Chondromalacia, left shoulder: Secondary | ICD-10-CM | POA: Diagnosis not present

## 2016-11-08 DIAGNOSIS — F17200 Nicotine dependence, unspecified, uncomplicated: Secondary | ICD-10-CM | POA: Diagnosis not present

## 2016-11-08 DIAGNOSIS — Z7982 Long term (current) use of aspirin: Secondary | ICD-10-CM | POA: Diagnosis not present

## 2016-11-08 DIAGNOSIS — Z79899 Other long term (current) drug therapy: Secondary | ICD-10-CM | POA: Insufficient documentation

## 2016-11-08 DIAGNOSIS — E7800 Pure hypercholesterolemia, unspecified: Secondary | ICD-10-CM | POA: Diagnosis not present

## 2016-11-08 DIAGNOSIS — M7542 Impingement syndrome of left shoulder: Secondary | ICD-10-CM | POA: Diagnosis present

## 2016-11-08 HISTORY — PX: SHOULDER ARTHROSCOPY WITH BICEPSTENOTOMY: SHX6204

## 2016-11-08 SURGERY — SHOULDER ARTHROSCOPY WITH BICEPS TENOTOMY
Anesthesia: Regional | Site: Shoulder | Laterality: Left | Wound class: Clean

## 2016-11-08 MED ORDER — SEVOFLURANE IN SOLN
RESPIRATORY_TRACT | Status: AC
  Filled 2016-11-08: qty 250

## 2016-11-08 MED ORDER — ROPIVACAINE HCL 5 MG/ML IJ SOLN
INTRAMUSCULAR | Status: AC
  Filled 2016-11-08: qty 30

## 2016-11-08 MED ORDER — FAMOTIDINE 20 MG PO TABS
20.0000 mg | ORAL_TABLET | Freq: Once | ORAL | Status: AC
  Administered 2016-11-08: 20 mg via ORAL

## 2016-11-08 MED ORDER — MORPHINE SULFATE (PF) 4 MG/ML IV SOLN
INTRAVENOUS | Status: DC | PRN
  Administered 2016-11-08: 4 mg via INTRAVENOUS

## 2016-11-08 MED ORDER — PROPOFOL 10 MG/ML IV BOLUS
INTRAVENOUS | Status: AC
  Filled 2016-11-08: qty 20

## 2016-11-08 MED ORDER — GABAPENTIN 400 MG PO CAPS: 400.0000 mg | ORAL_CAPSULE | Freq: Two times a day (BID) | ORAL | 3 refills | Status: DC

## 2016-11-08 MED ORDER — EPINEPHRINE PF 1 MG/ML IJ SOLN
INTRAMUSCULAR | Status: DC | PRN
  Administered 2016-11-08: 6 mL

## 2016-11-08 MED ORDER — ONDANSETRON HCL 4 MG/2ML IJ SOLN
INTRAMUSCULAR | Status: DC | PRN
Start: 2016-11-08 — End: 2016-11-08
  Administered 2016-11-08: 4 mg via INTRAVENOUS

## 2016-11-08 MED ORDER — ONDANSETRON HCL 4 MG/2ML IJ SOLN
INTRAMUSCULAR | Status: AC
  Filled 2016-11-08: qty 2

## 2016-11-08 MED ORDER — PROPOFOL 10 MG/ML IV BOLUS
INTRAVENOUS | Status: DC | PRN
  Administered 2016-11-08: 200 mg via INTRAVENOUS
  Administered 2016-11-08: 50 mg via INTRAVENOUS

## 2016-11-08 MED ORDER — FENTANYL CITRATE (PF) 100 MCG/2ML IJ SOLN: 25.0000 ug | INTRAMUSCULAR | Status: DC | PRN

## 2016-11-08 MED ORDER — DEXAMETHASONE SODIUM PHOSPHATE 10 MG/ML IJ SOLN
INTRAMUSCULAR | Status: AC
  Filled 2016-11-08: qty 1

## 2016-11-08 MED ORDER — LACTATED RINGERS IV SOLN
INTRAVENOUS | Status: DC
  Administered 2016-11-08 (×2): via INTRAVENOUS

## 2016-11-08 MED ORDER — ROPIVACAINE HCL 5 MG/ML IJ SOLN
INTRAMUSCULAR | Status: DC | PRN
  Administered 2016-11-08: 30 mL via PERINEURAL

## 2016-11-08 MED ORDER — PROMETHAZINE HCL 25 MG/ML IJ SOLN: 6.2500 mg | INTRAMUSCULAR | Status: DC | PRN

## 2016-11-08 MED ORDER — SODIUM CHLORIDE 0.9 % IJ SOLN
INTRAMUSCULAR | Status: AC
  Filled 2016-11-08: qty 10

## 2016-11-08 MED ORDER — FENTANYL CITRATE (PF) 100 MCG/2ML IJ SOLN
50.0000 ug | Freq: Once | INTRAMUSCULAR | Status: AC
  Administered 2016-11-08: 50 ug via INTRAVENOUS

## 2016-11-08 MED ORDER — CLINDAMYCIN PHOSPHATE 600 MG/50ML IV SOLN
INTRAVENOUS | Status: AC
  Filled 2016-11-08: qty 50

## 2016-11-08 MED ORDER — FAMOTIDINE 20 MG PO TABS
ORAL_TABLET | ORAL | Status: AC
  Administered 2016-11-08: 20 mg via ORAL
  Filled 2016-11-08: qty 1

## 2016-11-08 MED ORDER — MIDAZOLAM HCL 2 MG/2ML IJ SOLN
INTRAMUSCULAR | Status: AC
  Filled 2016-11-08: qty 2

## 2016-11-08 MED ORDER — EPHEDRINE SULFATE 50 MG/ML IJ SOLN
INTRAMUSCULAR | Status: DC | PRN
Start: 2016-11-08 — End: 2016-11-08
  Administered 2016-11-08 (×2): 10 mg via INTRAVENOUS

## 2016-11-08 MED ORDER — ROCURONIUM BROMIDE 50 MG/5ML IV SOLN
INTRAVENOUS | Status: AC
  Filled 2016-11-08: qty 1

## 2016-11-08 MED ORDER — MIDAZOLAM HCL 2 MG/2ML IJ SOLN
1.0000 mg | Freq: Once | INTRAMUSCULAR | Status: AC
  Administered 2016-11-08: 1 mg via INTRAVENOUS

## 2016-11-08 MED ORDER — FENTANYL CITRATE (PF) 100 MCG/2ML IJ SOLN
INTRAMUSCULAR | Status: AC
  Filled 2016-11-08: qty 2

## 2016-11-08 MED ORDER — ROCURONIUM BROMIDE 100 MG/10ML IV SOLN
INTRAVENOUS | Status: DC | PRN
  Administered 2016-11-08: 50 mg via INTRAVENOUS
  Administered 2016-11-08: 20 mg via INTRAVENOUS

## 2016-11-08 MED ORDER — EPHEDRINE SULFATE 50 MG/ML IJ SOLN
INTRAMUSCULAR | Status: AC
  Filled 2016-11-08: qty 1

## 2016-11-08 MED ORDER — HYDROCODONE-ACETAMINOPHEN 5-325 MG PO TABS: 1.0000 | ORAL_TABLET | Freq: Four times a day (QID) | ORAL | 0 refills | Status: DC | PRN

## 2016-11-08 MED ORDER — CHLORHEXIDINE GLUCONATE CLOTH 2 % EX PADS: 6.0000 | MEDICATED_PAD | Freq: Once | CUTANEOUS | Status: DC

## 2016-11-08 MED ORDER — FENTANYL CITRATE (PF) 100 MCG/2ML IJ SOLN
INTRAMUSCULAR | Status: AC
  Administered 2016-11-08: 50 ug via INTRAVENOUS
  Filled 2016-11-08: qty 2

## 2016-11-08 MED ORDER — BUPIVACAINE-EPINEPHRINE (PF) 0.25% -1:200000 IJ SOLN
INTRAMUSCULAR | Status: AC
  Filled 2016-11-08: qty 60

## 2016-11-08 MED ORDER — PHENYLEPHRINE HCL 10 MG/ML IJ SOLN
INTRAMUSCULAR | Status: DC | PRN
  Administered 2016-11-08: 200 ug via INTRAVENOUS

## 2016-11-08 MED ORDER — CEFAZOLIN SODIUM-DEXTROSE 2-4 GM/100ML-% IV SOLN
INTRAVENOUS | Status: AC
  Filled 2016-11-08: qty 100

## 2016-11-08 MED ORDER — MORPHINE SULFATE (PF) 4 MG/ML IV SOLN
INTRAVENOUS | Status: AC
  Filled 2016-11-08: qty 1

## 2016-11-08 MED ORDER — LIDOCAINE HCL (PF) 2 % IJ SOLN
INTRAMUSCULAR | Status: AC
  Filled 2016-11-08: qty 2

## 2016-11-08 MED ORDER — GABAPENTIN 400 MG PO CAPS
400.0000 mg | ORAL_CAPSULE | ORAL | Status: AC
  Administered 2016-11-08: 400 mg via ORAL

## 2016-11-08 MED ORDER — EPINEPHRINE 30 MG/30ML IJ SOLN
INTRAMUSCULAR | Status: AC
  Filled 2016-11-08: qty 1

## 2016-11-08 MED ORDER — LIDOCAINE HCL (CARDIAC) 20 MG/ML IV SOLN
INTRAVENOUS | Status: DC | PRN
  Administered 2016-11-08: 100 mg via INTRAVENOUS

## 2016-11-08 MED ORDER — SODIUM CHLORIDE 0.9 % IV SOLN
INTRAVENOUS | Status: DC | PRN
  Administered 2016-11-08: 25 ug/min via INTRAVENOUS

## 2016-11-08 MED ORDER — BUPIVACAINE-EPINEPHRINE (PF) 0.25% -1:200000 IJ SOLN
INTRAMUSCULAR | Status: DC | PRN
  Administered 2016-11-08 (×2): 30 mL via PERINEURAL

## 2016-11-08 MED ORDER — MELOXICAM 15 MG PO TABS: 15.0000 mg | ORAL_TABLET | Freq: Every day | ORAL | 3 refills | Status: DC

## 2016-11-08 MED ORDER — LIDOCAINE HCL (PF) 1 % IJ SOLN
INTRAMUSCULAR | Status: DC | PRN
  Administered 2016-11-08: 5 mL via SUBCUTANEOUS

## 2016-11-08 MED ORDER — LIDOCAINE HCL (PF) 1 % IJ SOLN
INTRAMUSCULAR | Status: AC
  Filled 2016-11-08: qty 5

## 2016-11-08 MED ORDER — MIDAZOLAM HCL 2 MG/2ML IJ SOLN
INTRAMUSCULAR | Status: AC
  Administered 2016-11-08: 1 mg via INTRAVENOUS
  Filled 2016-11-08: qty 2

## 2016-11-08 MED ORDER — GLYCOPYRROLATE 0.2 MG/ML IJ SOLN
INTRAMUSCULAR | Status: AC
  Filled 2016-11-08: qty 1

## 2016-11-08 MED ORDER — DEXAMETHASONE SODIUM PHOSPHATE 10 MG/ML IJ SOLN
INTRAMUSCULAR | Status: DC | PRN
  Administered 2016-11-08: 10 mg via INTRAVENOUS

## 2016-11-08 MED ORDER — GABAPENTIN 400 MG PO CAPS
ORAL_CAPSULE | ORAL | Status: AC
  Administered 2016-11-08: 400 mg via ORAL
  Filled 2016-11-08: qty 1

## 2016-11-08 MED ORDER — GLYCOPYRROLATE 0.2 MG/ML IJ SOLN
INTRAMUSCULAR | Status: DC | PRN
  Administered 2016-11-08: 0.2 mg via INTRAVENOUS

## 2016-11-08 MED ORDER — SUGAMMADEX SODIUM 500 MG/5ML IV SOLN
INTRAVENOUS | Status: AC
  Filled 2016-11-08: qty 5

## 2016-11-08 MED ORDER — SUGAMMADEX SODIUM 500 MG/5ML IV SOLN
INTRAVENOUS | Status: DC | PRN
  Administered 2016-11-08: 200 mg via INTRAVENOUS

## 2016-11-08 SURGICAL SUPPLY — 50 items
ADAPTER IRRIG TUBE 2 SPIKE SOL (ADAPTER) ×4 IMPLANT
BLADE AGGRESSIVE PLUS 4.0 (BLADE) ×4 IMPLANT
BUR AGGRESSIVE+ 5.5 (BURR) IMPLANT
BUR BR 5.5 12 FLUTE (BURR) ×4 IMPLANT
BUR RADIUS 4.0X18.5 (BURR) IMPLANT
BUR RADIUS 5.5 (BURR) IMPLANT
CANNULA 5.75X7 CRYSTAL CLEAR (CANNULA) IMPLANT
CANNULA 8.5X75 THRED (CANNULA) IMPLANT
CANNULA PARTIAL THREAD 2X7 (CANNULA) IMPLANT
CHLORAPREP W/TINT 26ML (MISCELLANEOUS) ×4 IMPLANT
CONNECTOR PERFECT PASSER (CONNECTOR) IMPLANT
COVER MAYO STAND STRL (DRAPES) ×4 IMPLANT
DRAPE IMP U-DRAPE 54X76 (DRAPES) ×8 IMPLANT
DRAPE SHEET LG 3/4 BI-LAMINATE (DRAPES) IMPLANT
DRAPE STERI 35X30 U-POUCH (DRAPES) ×4 IMPLANT
GAUZE PETRO XEROFOAM 1X8 (MISCELLANEOUS) ×4 IMPLANT
GAUZE SPONGE 4X4 12PLY STRL (GAUZE/BANDAGES/DRESSINGS) ×8 IMPLANT
GLOVE SURG ORTHO 8.0 STRL STRW (GLOVE) ×4 IMPLANT
GOWN STRL REUS W/TWL LRG LVL4 (GOWN DISPOSABLE) ×4 IMPLANT
IV LACTATED RINGER IRRG 3000ML (IV SOLUTION) ×12
IV LR IRRIG 3000ML ARTHROMATIC (IV SOLUTION) ×12 IMPLANT
KIT RM TURNOVER STRD PROC AR (KITS) ×4 IMPLANT
KIT SHOULDER TRACTION (DRAPES) ×4 IMPLANT
MANIFOLD NEPTUNE II (INSTRUMENTS) ×4 IMPLANT
MAT BLUE FLOOR 46X72 FLO (MISCELLANEOUS) ×4 IMPLANT
NDL SAFETY 18GX1.5 (NEEDLE) ×4 IMPLANT
NEEDLE SPNL 18GX3.5 QUINCKE PK (NEEDLE) ×4 IMPLANT
NS IRRIG 500ML POUR BTL (IV SOLUTION) IMPLANT
PACK ARTHROSCOPY SHOULDER (MISCELLANEOUS) ×4 IMPLANT
PASSER SUT CAPTURE FIRST (SUTURE) IMPLANT
SET TUBE SUCT SHAVER OUTFL 24K (TUBING) ×4 IMPLANT
SLING ARM LRG DEEP (SOFTGOODS) ×4 IMPLANT
SLING ULTRA II LG (MISCELLANEOUS) ×4 IMPLANT
SOL PREP PVP 2OZ (MISCELLANEOUS) ×4
SOLUTION PREP PVP 2OZ (MISCELLANEOUS) ×2 IMPLANT
SUT ETHILON 3 0 FSLX (SUTURE) IMPLANT
SUT PDS PLUS 0 (SUTURE)
SUT PDS PLUS AB 0 CT-2 (SUTURE) IMPLANT
SUT PERFECTPASSER WHITE CART (SUTURE) IMPLANT
SUT VIC AB 2-0 CT2 27 (SUTURE) IMPLANT
SUT VICRYL 3-0 27IN (SUTURE) IMPLANT
SUTURE MAGNUM WIRE 2X48 BLK (SUTURE) IMPLANT
SYR 20CC LL (SYRINGE) ×4 IMPLANT
SYR 30ML LL (SYRINGE) ×4 IMPLANT
SYR 50ML LL SCALE MARK (SYRINGE) ×4 IMPLANT
TUBING ARTHRO INFLOW-ONLY STRL (TUBING) ×4 IMPLANT
TUBING CONNECTING 10 (TUBING) ×3 IMPLANT
TUBING CONNECTING 10' (TUBING) ×1
WAND COBLATION FLOW 50 (SURGICAL WAND) IMPLANT
WAND HAND CNTRL MULTIVAC 90 (MISCELLANEOUS) ×4 IMPLANT

## 2016-11-08 NOTE — OR Nursing (Signed)
Transferred to PACU for block 

## 2016-11-08 NOTE — Anesthesia Preprocedure Evaluation (Signed)
Anesthesia Evaluation  Patient identified by MRN, date of birth, ID band Patient awake    Reviewed: Allergy & Precautions, H&P , NPO status , Patient's Chart, lab work & pertinent test results, reviewed documented beta blocker date and time   History of Anesthesia Complications Negative for: history of anesthetic complications  Airway Mallampati: II  TM Distance: >3 FB Neck ROM: full    Dental  (+) Chipped, Caps, Dental Advidsory Given, Teeth Intact   Pulmonary neg shortness of breath, neg sleep apnea, neg COPD, neg recent URI, Current Smoker,           Cardiovascular Exercise Tolerance: Good hypertension, (-) angina(-) CAD, (-) Past MI, (-) Cardiac Stents and (-) CABG (-) dysrhythmias (-) Valvular Problems/Murmurs     Neuro/Psych PSYCHIATRIC DISORDERS (Bipolar) negative neurological ROS     GI/Hepatic negative GI ROS, Neg liver ROS,   Endo/Other  negative endocrine ROS  Renal/GU negative Renal ROS  negative genitourinary   Musculoskeletal   Abdominal   Peds  Hematology negative hematology ROS (+)   Anesthesia Other Findings Past Medical History: No date: Arthritis No date: Bipolar disorder (HCC) No date: Depression No date: Fibromyalgia No date: History of kidney infection No date: Hypercholesterolemia No date: Hypertension 11/2010: S/P arthroscopy of right shoulder   Reproductive/Obstetrics negative OB ROS                             Anesthesia Physical Anesthesia Plan  ASA: II  Anesthesia Plan: General and Regional   Post-op Pain Management: GA combined w/ Regional for post-op pain   Induction:   Airway Management Planned:   Additional Equipment:   Intra-op Plan:   Post-operative Plan:   Informed Consent: I have reviewed the patients History and Physical, chart, labs and discussed the procedure including the risks, benefits and alternatives for the proposed anesthesia  with the patient or authorized representative who has indicated his/her understanding and acceptance.   Dental Advisory Given  Plan Discussed with: Anesthesiologist, CRNA and Surgeon  Anesthesia Plan Comments:         Anesthesia Quick Evaluation

## 2016-11-08 NOTE — Anesthesia Procedure Notes (Signed)
Anesthesia Regional Block: Interscalene brachial plexus block   Pre-Anesthetic Checklist: ,, timeout performed, Correct Patient, Correct Site, Correct Laterality, Correct Procedure, Correct Position, site marked, Risks and benefits discussed,  Surgical consent,  Pre-op evaluation,  At surgeon's request and post-op pain management  Laterality: Left and Upper  Prep: chloraprep       Needles:  Injection technique: Single-shot  Needle Type: Stimiplex     Needle Length: 5cm  Needle Gauge: 22     Additional Needles:   Procedures: ultrasound guided,,,,,,,,  Narrative:  Start time: 11/08/2016 9:23 AM End time: 11/08/2016 9:27 AM Injection made incrementally with aspirations every 5 mL.  Performed by: Personally  Anesthesiologist: Martha Clan  Additional Notes: Functioning IV was confirmed and monitors were applied.  A 48mm 22ga Stimuplex needle was used. Sterile prep and drape,hand hygiene and sterile gloves were used.  Negative aspiration and negative test dose prior to incremental administration of local anesthetic. The patient tolerated the procedure well.

## 2016-11-08 NOTE — Anesthesia Postprocedure Evaluation (Signed)
Anesthesia Post Note  Patient: [name]  Procedure(s) Performed: Procedure(s) (LRB): SHOULDER ARTHROSCOPY WITH BICEPSTENOTOMY (Left) SHOULDER ARTHROSCOPY WITH SUBACROMIAL DECOMPRESSION AND DISTAL CLAVICLE EXCISION (Left)  Patient location during evaluation: PACU Anesthesia Type: Regional Level of consciousness: awake and alert Pain management: pain level controlled Vital Signs Assessment: post-procedure vital signs reviewed and stable Respiratory status: spontaneous breathing, nonlabored ventilation, respiratory function stable and patient connected to nasal cannula oxygen Cardiovascular status: blood pressure returned to baseline and stable Postop Assessment: no signs of nausea or vomiting Anesthetic complications: no     Last Vitals:  Vitals:   11/08/16 1230 11/08/16 1259  BP: 132/64 129/66  Pulse: 69 67  Resp: 18   Temp:      Last Pain:  Vitals:   11/08/16 1230  TempSrc:   PainSc: 0-No pain                 Martha Clan

## 2016-11-08 NOTE — Anesthesia Procedure Notes (Signed)
Procedure Name: Intubation Date/Time: 11/08/2016 9:51 AM Performed by: Silvana Newness Pre-anesthesia Checklist: Patient identified, Emergency Drugs available, Suction available, Patient being monitored and Timeout performed Patient Re-evaluated:Patient Re-evaluated prior to inductionOxygen Delivery Method: Circle system utilized Preoxygenation: Pre-oxygenation with 100% oxygen Intubation Type: IV induction Ventilation: Mask ventilation without difficulty Laryngoscope Size: Mac and 3 Grade View: Grade I Tube type: Oral Tube size: 7.0 mm Number of attempts: 1 Airway Equipment and Method: Stylet Placement Confirmation: ETT inserted through vocal cords under direct vision,  positive ETCO2 and breath sounds checked- equal and bilateral Secured at: 20 cm Tube secured with: Tape Dental Injury: Teeth and Oropharynx as per pre-operative assessment

## 2016-11-08 NOTE — Anesthesia Procedure Notes (Signed)
Procedures

## 2016-11-08 NOTE — Anesthesia Post-op Follow-up Note (Cosign Needed)
Anesthesia QCDR form completed.        

## 2016-11-08 NOTE — Op Note (Signed)
11/08/2016  11:21 AM  PATIENT:  [name]    PRE-OPERATIVE DIAGNOSIS:  M75.42 Impingement syndrome of left shoulder  POST-OPERATIVE DIAGNOSIS:  Same  PROCEDURE:  SHOULDER ARTHROSCOPY WITH BICEPSTENOTOMY, SHOULDER ARTHROSCOPY WITH SUBACROMIAL DECOMPRESSION AND DISTAL CLAVICLE EXCISION  SURGEON:  Park Breed, MD  ANESTHESIA:   General  PREOPERATIVE INDICATIONS:  [name] is a  66 y.o. female with a diagnosis of M75.42 Impingement syndrome of left shoulder who failed conservative measures and elected for surgical management.    The risks benefits and alternatives were discussed with the patient preoperatively including but not limited to the risks of infection, bleeding, nerve injury, cardiopulmonary complications, the need for revision surgery, among others, and the patient was willing to proceed.  EBL: Minimal   OPERATIVE FINDINGS: There was bare bone on the humeral head and glenoid.  The labrum was severely frayed.  The biceps tendon showed some fraying.  Humeral head had further chondromalacia.  The undersurface of rotator cuff was frayed but intact.  The bursal surface of the rotator cuff was also frayed but intact.  There was moderate bursitis.  There is prominence of the acromion anteriorly and complete deterioration of the acromioclavicular joint.  OPERATIVE PROCEDURE: The patient was brought to the operating room where satisfactory general endotracheal and interscalene anesthesia were accomplished.The patient was turned into the lateral decubitus position and the shoulder was prepped and draped in a sterile fashion. Arthroscopy was carried out from a posterior portal with accessory portals laterally and anteriorly. The  joint was examined first. The above findings were encountered. The motorized shaver was introduced anteriorly and the undersurface of the rotator cuff probed and lightly debrided. The biceps tendon was tenotomized.. The labrum was trimmed up with the ArthroCare  wand. The arthroscope was redirected into the subacromial space. There was severe bursitis which was resected with the motorized shaver and ArthroCare wand. The large bur was introduced from a posterior portal and the anterior acromion was debrided. The undersurface of the clavicle was debrided with the bur which was then reintroduced from an anterior portal and the remaining distal clavicle completely excised.   Final debridement was carried out with the motorized shaver and the ArthroCare wand. The joint was flushed and the stab wounds and closed with 3-0 nylon suture. 1/4% Marcaine with morphine was injected into the shoulder. Sponge and needle counts were correct.   The dry sterile dressing and was applied along with a  padded sling. Patient was awakened and taken recovery in good condition.  Park Breed, MD

## 2016-11-08 NOTE — Discharge Instructions (Signed)

## 2016-11-08 NOTE — Transfer of Care (Signed)
Immediate Anesthesia Transfer of Care Note  Patient: [name]  Procedure(s) Performed: Procedure(s): SHOULDER ARTHROSCOPY WITH BICEPSTENOTOMY (Left) SHOULDER ARTHROSCOPY WITH SUBACROMIAL DECOMPRESSION AND DISTAL CLAVICLE EXCISION (Left)  Patient Location: PACU  Anesthesia Type:GA combined with regional for post-op pain  Level of Consciousness: drowsy and patient cooperative  Airway & Oxygen Therapy: Patient Spontanous Breathing and Patient connected to face mask oxygen  Post-op Assessment: Report given to RN, Post -op Vital signs reviewed and stable and Patient moving all extremities X 4  Post vital signs: Reviewed and stable  Last Vitals:  Vitals:   11/08/16 0907 11/08/16 1136  BP: (!) 154/80   Pulse: (!) 58   Resp: 20   Temp:  (P) 36.7 C    Last Pain:  Vitals:   11/08/16 0812  TempSrc: Tympanic         Complications: No apparent anesthesia complications

## 2016-11-08 NOTE — H&P (Signed)
THE PATIENT WAS SEEN PRIOR TO SURGERY TODAY.  HISTORY, ALLERGIES, HOME MEDICATIONS AND OPERATIVE PROCEDURE WERE REVIEWED. RISKS AND BENEFITS OF SURGERY DISCUSSED WITH PATIENT AGAIN.  NO CHANGES FROM INITIAL HISTORY AND PHYSICAL NOTED.    

## 2017-07-03 DIAGNOSIS — I209 Angina pectoris, unspecified: Secondary | ICD-10-CM

## 2017-07-03 DIAGNOSIS — R011 Cardiac murmur, unspecified: Secondary | ICD-10-CM

## 2017-07-03 HISTORY — DX: Cardiac murmur, unspecified: R01.1

## 2017-07-03 HISTORY — DX: Angina pectoris, unspecified: I20.9

## 2017-07-31 ENCOUNTER — Other Ambulatory Visit: Payer: Self-pay | Admitting: Student

## 2017-07-31 DIAGNOSIS — R29898 Other symptoms and signs involving the musculoskeletal system: Secondary | ICD-10-CM

## 2017-08-06 ENCOUNTER — Ambulatory Visit
Admission: RE | Admit: 2017-08-06 | Discharge: 2017-08-06 | Disposition: A | Discharge status: Home / Self Care | Payer: Medicare PPO | Source: Ambulatory Visit | Attending: Student | Admitting: Student

## 2017-08-06 DIAGNOSIS — M47816 Spondylosis without myelopathy or radiculopathy, lumbar region: Secondary | ICD-10-CM | POA: Diagnosis not present

## 2017-08-06 DIAGNOSIS — R32 Unspecified urinary incontinence: Secondary | ICD-10-CM | POA: Diagnosis not present

## 2017-08-06 DIAGNOSIS — M4807 Spinal stenosis, lumbosacral region: Secondary | ICD-10-CM | POA: Insufficient documentation

## 2017-08-06 DIAGNOSIS — M4317 Spondylolisthesis, lumbosacral region: Secondary | ICD-10-CM | POA: Insufficient documentation

## 2017-08-06 DIAGNOSIS — M5127 Other intervertebral disc displacement, lumbosacral region: Secondary | ICD-10-CM | POA: Diagnosis not present

## 2017-08-06 DIAGNOSIS — R531 Weakness: Secondary | ICD-10-CM | POA: Diagnosis present

## 2017-08-06 DIAGNOSIS — M48061 Spinal stenosis, lumbar region without neurogenic claudication: Secondary | ICD-10-CM | POA: Insufficient documentation

## 2017-08-06 DIAGNOSIS — R29898 Other symptoms and signs involving the musculoskeletal system: Secondary | ICD-10-CM

## 2017-12-12 ENCOUNTER — Ambulatory Visit: Payer: Self-pay | Admitting: Orthopedic Surgery

## 2017-12-18 ENCOUNTER — Other Ambulatory Visit: Payer: Self-pay | Admitting: Orthopedic Surgery

## 2017-12-18 DIAGNOSIS — M19012 Primary osteoarthritis, left shoulder: Secondary | ICD-10-CM

## 2017-12-24 ENCOUNTER — Ambulatory Visit
Admission: RE | Admit: 2017-12-24 | Discharge: 2017-12-24 | Disposition: A | Discharge status: Home / Self Care | Payer: Medicare PPO | Source: Ambulatory Visit | Attending: Orthopedic Surgery | Admitting: Orthopedic Surgery

## 2017-12-24 DIAGNOSIS — M19012 Primary osteoarthritis, left shoulder: Secondary | ICD-10-CM

## 2017-12-31 DIAGNOSIS — S81801A Unspecified open wound, right lower leg, initial encounter: Secondary | ICD-10-CM

## 2017-12-31 DIAGNOSIS — C801 Malignant (primary) neoplasm, unspecified: Secondary | ICD-10-CM

## 2017-12-31 DIAGNOSIS — I499 Cardiac arrhythmia, unspecified: Secondary | ICD-10-CM

## 2017-12-31 HISTORY — DX: Unspecified open wound, right lower leg, initial encounter: S81.801A

## 2017-12-31 HISTORY — DX: Malignant (primary) neoplasm, unspecified: C80.1

## 2017-12-31 HISTORY — DX: Cardiac arrhythmia, unspecified: I49.9

## 2018-01-25 ENCOUNTER — Other Ambulatory Visit: Payer: Self-pay

## 2018-01-25 ENCOUNTER — Encounter
Admission: RE | Admit: 2018-01-25 | Discharge: 2018-01-25 | Disposition: A | Discharge status: Home / Self Care | Payer: Medicare PPO | Source: Ambulatory Visit | Attending: Orthopedic Surgery | Admitting: Orthopedic Surgery

## 2018-01-25 DIAGNOSIS — Z01812 Encounter for preprocedural laboratory examination: Secondary | ICD-10-CM | POA: Insufficient documentation

## 2018-01-25 HISTORY — DX: Chronic kidney disease, unspecified: N18.9

## 2018-01-25 HISTORY — DX: Angina pectoris, unspecified: I20.9

## 2018-01-25 HISTORY — DX: Malignant (primary) neoplasm, unspecified: C80.1

## 2018-01-25 HISTORY — DX: Cardiac arrhythmia, unspecified: I49.9

## 2018-01-25 HISTORY — DX: Cardiac murmur, unspecified: R01.1

## 2018-01-25 LAB — CBC
HEMATOCRIT: 41.4 % (ref 35.0–47.0)
HEMOGLOBIN: 14.4 g/dL (ref 12.0–16.0)
MCH: 30.7 pg (ref 26.0–34.0)
MCHC: 34.9 g/dL (ref 32.0–36.0)
MCV: 87.9 fL (ref 80.0–100.0)
Platelets: 228 10*3/uL (ref 150–440)
RBC: 4.71 MIL/uL (ref 3.80–5.20)
RDW: 13.5 % (ref 11.5–14.5)
WBC: 4.4 10*3/uL (ref 3.6–11.0)

## 2018-01-25 LAB — BASIC METABOLIC PANEL
ANION GAP: 9 (ref 5–15)
BUN: 19 mg/dL (ref 8–23)
CALCIUM: 9 mg/dL (ref 8.9–10.3)
CO2: 31 mmol/L (ref 22–32)
Chloride: 100 mmol/L (ref 98–111)
Creatinine, Ser: 0.85 mg/dL (ref 0.44–1.00)
GFR calc non Af Amer: >60 mL/min (ref >60)
Glucose, Bld: 107 mg/dL — ABNORMAL HIGH (ref 70–99)
POTASSIUM: 3.6 mmol/L (ref 3.5–5.1)
SODIUM: 140 mmol/L (ref 135–145)

## 2018-01-25 LAB — URINALYSIS, ROUTINE W REFLEX MICROSCOPIC
Bilirubin Urine: NEGATIVE
Glucose, UA: NEGATIVE mg/dL
Hgb urine dipstick: NEGATIVE
KETONES UR: 5 mg/dL — AB
LEUKOCYTES UA: NEGATIVE
NITRITE: NEGATIVE
PH: 6 (ref 5.0–8.0)
PROTEIN: NEGATIVE mg/dL
Specific Gravity, Urine: 1.023 (ref 1.005–1.030)

## 2018-01-25 LAB — SURGICAL PCR SCREEN
MRSA, PCR: NEGATIVE
STAPHYLOCOCCUS AUREUS: NEGATIVE

## 2018-01-25 LAB — PROTIME-INR
INR: 0.9
PROTHROMBIN TIME: 12.1 s (ref 11.4–15.2)

## 2018-01-25 LAB — APTT: APTT: 30 s (ref 24–36)

## 2018-01-25 NOTE — Pre-Procedure Instructions (Signed)
ECG 12-lead6/19/2019 [place] Component Name Value Ref Range  Vent Rate (bpm) 60   PR Interval (msec) 214   QRS Interval (msec) 98   QT Interval (msec) 444   QTc (msec) 444   Other Result Information  This result has an attachment that is not available.  Result Narrative  Sinus rhythm 1st degree AV block Left ventricular hypertrophy Abnormal ECG  When compared with ECG of 09/26/12 Sinus rhythm has replaced Sinus bradycardia 1st degree AV block is now present I reviewed and concur with this report. Electronically signed CC:[email or web address], MD, AUGUSTUS ([id number]) on 12/19/2017 6:02:58 PM

## 2018-01-25 NOTE — Patient Instructions (Signed)
Your procedure is scheduled on: Wednesday, February 13, 2018  Report to [place].    DO NOT STOP ON THE FIRST FLOOR TO REGISTER  To find out your arrival time please call [phone] between 1PM - 3PM on  Tuesday, February 12, 2018  Remember: Instructions that are not followed completely may result in serious medical risk,  up to and including death, or upon the discretion of your surgeon and anesthesiologist your  surgery may need to be rescheduled.     _X__ 1. Do not eat food after midnight the night before your procedure.                 No gum chewing or hard candies. NO TUMS, NO TIC TACS                  NOTHING SOLID IN YOUR MOUTH AFTER MIDNIGHT                  You may drink clear liquids up to 2 hours before you are scheduled to arrive for your surgery-                  DO not drink clear liquids within 2 hours of the start of your surgery.                  Clear Liquids include:  water, apple juice without pulp, clear carbohydrate                 drink such as Clearfast of Gatorade, Black Coffee or Tea (Do not add                 anything to coffee or tea). NO DAIRY PRODUCTS , NO SOUPS OR BROTH  __X__2.  On the morning of surgery brush your teeth with toothpaste and water,                   You may rinse your mouth with mouthwash if you wish.                      Do not swallow any toothpaste of mouthwash.     _X__ 3.  No Alcohol for 24 hours before or after surgery.   _X__ 4.  Do Not Smoke or use e-cigarettes For 24 Hours Prior to Your Surgery.                 Do not use any chewable tobacco products for at least 6 hours prior to                 surgery.  ____  5.  Bring all medications with you on the day of surgery if instructed.   _X___  6.  Notify your doctor if there is any change in your medical condition      (cold, fever, infections).     Do not wear jewelry, make-up, hairpins, clips or nail polish. Do not wear  lotions, powders, or perfumes. You may  NOT wear deodorant ON DAY OF SURGERY Do not shave 48 hours prior to surgery. Men may shave face and neck. Do not bring valuables to the hospital.    [facility] is not responsible for any belongings or valuables.  Contacts, dentures or bridgework may not be worn into surgery. Leave your suitcase in the car. After surgery it may be brought to your room. For patients admitted to  the hospital, discharge time is determined by your treatment team.   Patients discharged the day of surgery will not be allowed to drive home.   Please read over the following fact sheets that you were given:   MRSA: [place]   ____ Take these medicines the morning of surgery with A SIP OF WATER:    1. AMLODIPINE  2. GABAPENTIN  3.  LAMICTAL  4.  5.  6.  ____ Fleet Enema (as directed)   __X__ Use CHG Soap as directed  __X__ Stop ASPIRIN ONE WEEK BEFORE SURGERY (February 06, 2018)  __X__ Stop Anti-inflammatories ONE WEEK BEFORE SURGERY (February 06, 2018)              THIS INCLUDES IBUPROFEN / MOTRIN / ADVIL / ALEVE / MOBIC                     TYLENOL IS ACCEPTABLE AT [facility]  _X___ Stop supplements until after surgery.    ____ Bring C-Pap to the hospital.   CONTINUE TAKING NIGHT TIME MEDS AS ORDERED.  STOP BELBUCA PATCH 2 DAYS PRIOR TO SURGERY (PER [facility])  WEAR LOOSE TO HOSPITAL  BRING POA PAPERS WITH YOU IF YOU COMPLETE PRIOR TO SURGERY

## 2018-01-26 NOTE — Pre-Procedure Instructions (Signed)
UA results sent to Dr. Bowers for review. 

## 2018-02-12 NOTE — Pre-Procedure Instructions (Signed)
CARDIAC CLEARANCE FROM DR Nehemiah Massed ON CHART

## 2018-02-13 ENCOUNTER — Encounter: Admission: RE | Payer: Self-pay | Source: Ambulatory Visit

## 2018-02-13 ENCOUNTER — Inpatient Hospital Stay: Admission: RE | Admit: 2018-02-13 | Payer: Medicare PPO | Source: Ambulatory Visit | Admitting: Orthopedic Surgery

## 2018-02-13 ENCOUNTER — Other Ambulatory Visit: Payer: Medicare PPO

## 2018-02-13 SURGERY — ARTHROPLASTY, SHOULDER, TOTAL
Anesthesia: Choice | Laterality: Left

## 2018-04-16 ENCOUNTER — Ambulatory Visit: Payer: Self-pay | Admitting: Orthopedic Surgery

## 2018-04-17 ENCOUNTER — Other Ambulatory Visit: Payer: Self-pay

## 2018-04-17 ENCOUNTER — Encounter
Admission: RE | Admit: 2018-04-17 | Discharge: 2018-04-17 | Disposition: A | Discharge status: Home / Self Care | Payer: Medicare PPO | Source: Ambulatory Visit | Attending: Orthopedic Surgery | Admitting: Orthopedic Surgery

## 2018-04-17 DIAGNOSIS — Z01812 Encounter for preprocedural laboratory examination: Secondary | ICD-10-CM | POA: Insufficient documentation

## 2018-04-17 HISTORY — DX: Spinal stenosis, lumbosacral region: M48.07

## 2018-04-17 HISTORY — DX: Personal history of Methicillin resistant Staphylococcus aureus infection: Z86.14

## 2018-04-17 HISTORY — DX: Diverticulosis of intestine, part unspecified, without perforation or abscess without bleeding: K57.90

## 2018-04-17 HISTORY — DX: Unspecified open wound, right lower leg, initial encounter: S81.801A

## 2018-04-17 LAB — CBC
HCT: 41.8 % (ref 36.0–46.0)
HEMOGLOBIN: 13.7 g/dL (ref 12.0–15.0)
MCH: 29.8 pg (ref 26.0–34.0)
MCHC: 32.8 g/dL (ref 30.0–36.0)
MCV: 90.9 fL (ref 80.0–100.0)
Platelets: 220 10*3/uL (ref 150–400)
RBC: 4.6 MIL/uL (ref 3.87–5.11)
RDW: 13.2 % (ref 11.5–15.5)
WBC: 3.8 10*3/uL — ABNORMAL LOW (ref 4.0–10.5)
nRBC: 0 % (ref 0.0–0.2)

## 2018-04-17 LAB — URINALYSIS, ROUTINE W REFLEX MICROSCOPIC
BILIRUBIN URINE: NEGATIVE
Glucose, UA: NEGATIVE mg/dL
Hgb urine dipstick: NEGATIVE
KETONES UR: NEGATIVE mg/dL
Leukocytes, UA: NEGATIVE
NITRITE: NEGATIVE
PH: 7 (ref 5.0–8.0)
Protein, ur: NEGATIVE mg/dL
SPECIFIC GRAVITY, URINE: 1.014 (ref 1.005–1.030)

## 2018-04-17 LAB — BASIC METABOLIC PANEL
ANION GAP: 10 (ref 5–15)
BUN: 16 mg/dL (ref 8–23)
CHLORIDE: 101 mmol/L (ref 98–111)
CO2: 32 mmol/L (ref 22–32)
Calcium: 8.9 mg/dL (ref 8.9–10.3)
Creatinine, Ser: 0.8 mg/dL (ref 0.44–1.00)
GFR calc non Af Amer: >60 mL/min (ref >60)
GLUCOSE: 111 mg/dL — AB (ref 70–99)
POTASSIUM: 3.3 mmol/L — AB (ref 3.5–5.1)
Sodium: 143 mmol/L (ref 135–145)

## 2018-04-17 LAB — PROTIME-INR
INR: 0.99
Prothrombin Time: 13 seconds (ref 11.4–15.2)

## 2018-04-17 LAB — SURGICAL PCR SCREEN
MRSA, PCR: NEGATIVE
STAPHYLOCOCCUS AUREUS: NEGATIVE

## 2018-04-17 LAB — APTT: aPTT: 31 seconds (ref 24–36)

## 2018-04-17 NOTE — Patient Instructions (Signed)
Your procedure is scheduled on: Wednesday, May 01, 2018 Report to Day Surgery on the 2nd floor of the [facility]. To find out your arrival time, please call [phone] between 1PM - 3PM on: Tuesday, April 30, 2018  REMEMBER: Instructions that are not followed completely may result in serious medical risk, up to and including death; or upon the discretion of your surgeon and anesthesiologist your surgery may need to be rescheduled.  Do not eat food after midnight the night before surgery.  No gum chewing, lozengers or hard candies.  You may however, drink CLEAR liquids up to 2 hours before you are scheduled to arrive for your surgery. Do not drink anything within 2 hours of the start of your surgery.  Clear liquids include: - water  - apple juice without pulp - gatorade - black coffee or tea (Do NOT add milk or creamers to the coffee or tea) Do NOT drink anything that is not on this list.  No Alcohol for 24 hours before or after surgery.  No Smoking including e-cigarettes for 24 hours prior to surgery.  No chewable tobacco products for at least 6 hours prior to surgery.  No nicotine patches on the day of surgery.  On the morning of surgery brush your teeth with toothpaste and water, you may rinse your mouth with mouthwash if you wish. Do not swallow any toothpaste or mouthwash.  Notify your doctor if there is any change in your medical condition (cold, fever, infection).  Do not wear jewelry, make-up, hairpins, clips or nail polish.  Do not wear lotions, powders, or perfumes. You may wear deodorant.  Do not shave 48 hours prior to surgery.   Contacts and dentures may not be worn into surgery.  Do not bring valuables to the hospital, including drivers license, insurance or credit cards.  [facility] is not responsible for any belongings or valuables.   TAKE THESE MEDICATIONS THE MORNING OF SURGERY:  1.  AMLODIPINE 2.  GABAPENTIN 3.  LAMICTAL  Use CHG Soap as  directed on instruction sheet.  Follow recommendations from Cardiologist, Pulmonologist or PCP regarding stopping Aspirin. STOP 7 DAYS PRIOR TO SURGERY; ON October 23  ON October 23 - Stop MELOXICAM and Anti-inflammatories (NSAIDS) such as Advil, Aleve, Ibuprofen, Motrin, Naproxen, Naprosyn and Aspirin based products such as Excedrin, Goodys Powder, BC Powder. (May take Tylenol or Acetaminophen if needed.)  Stop ANY OVER THE COUNTER supplements until after surgery. (FISH OIL, GLUCOSAMINE, PRESERVISION AREDS, COGNIUM) (May continue Vitamin D, Vitamin B, and multivitamin.)  Wear comfortable clothing (specific to your surgery type) to the hospital.  Plan for stool softeners for home use.  If you are being admitted to the hospital overnight, leave your suitcase in the car. After surgery it may be brought to your room.  Please call [phone] if you have any questions about these instructions.

## 2018-04-17 NOTE — Progress Notes (Signed)
K+ level today resulted at 3.3; labs faxed to Dr. Harlow Mares office informing that the patient will need a K+ supplement prior to surgery. I-Stat K+ check order put in for day of surgery to recheck then.

## 2018-04-30 MED ORDER — CEFAZOLIN SODIUM-DEXTROSE 2-4 GM/100ML-% IV SOLN
2.0000 g | INTRAVENOUS | Status: AC
  Administered 2018-05-01: 2 g via INTRAVENOUS

## 2018-04-30 MED ORDER — TRANEXAMIC ACID-NACL 1000-0.7 MG/100ML-% IV SOLN
1000.0000 mg | INTRAVENOUS | Status: DC
  Filled 2018-04-30: qty 100

## 2018-05-01 ENCOUNTER — Encounter
Admission: RE | Disposition: A | Discharge status: Home / Self Care | Payer: Self-pay | Source: Ambulatory Visit | Attending: Orthopedic Surgery

## 2018-05-01 ENCOUNTER — Inpatient Hospital Stay: Payer: Medicare PPO | Admitting: Anesthesiology

## 2018-05-01 ENCOUNTER — Other Ambulatory Visit: Payer: Self-pay

## 2018-05-01 ENCOUNTER — Inpatient Hospital Stay
Admission: RE | Admit: 2018-05-01 | Discharge: 2018-05-02 | DRG: 483 | Disposition: A | Discharge status: Home / Self Care | Payer: Medicare PPO | Source: Ambulatory Visit | Attending: Orthopedic Surgery | Admitting: Orthopedic Surgery

## 2018-05-01 ENCOUNTER — Inpatient Hospital Stay: Payer: Medicare PPO

## 2018-05-01 DIAGNOSIS — Z791 Long term (current) use of non-steroidal anti-inflammatories (NSAID): Secondary | ICD-10-CM | POA: Diagnosis not present

## 2018-05-01 DIAGNOSIS — I10 Essential (primary) hypertension: Secondary | ICD-10-CM | POA: Diagnosis present

## 2018-05-01 DIAGNOSIS — M19012 Primary osteoarthritis, left shoulder: Principal | ICD-10-CM | POA: Diagnosis present

## 2018-05-01 DIAGNOSIS — M797 Fibromyalgia: Secondary | ICD-10-CM | POA: Diagnosis present

## 2018-05-01 DIAGNOSIS — E7800 Pure hypercholesterolemia, unspecified: Secondary | ICD-10-CM | POA: Diagnosis present

## 2018-05-01 DIAGNOSIS — I440 Atrioventricular block, first degree: Secondary | ICD-10-CM | POA: Diagnosis present

## 2018-05-01 DIAGNOSIS — Z87891 Personal history of nicotine dependence: Secondary | ICD-10-CM | POA: Diagnosis not present

## 2018-05-01 DIAGNOSIS — Z9884 Bariatric surgery status: Secondary | ICD-10-CM | POA: Diagnosis not present

## 2018-05-01 DIAGNOSIS — J449 Chronic obstructive pulmonary disease, unspecified: Secondary | ICD-10-CM | POA: Diagnosis present

## 2018-05-01 DIAGNOSIS — Z79899 Other long term (current) drug therapy: Secondary | ICD-10-CM

## 2018-05-01 DIAGNOSIS — Z09 Encounter for follow-up examination after completed treatment for conditions other than malignant neoplasm: Secondary | ICD-10-CM

## 2018-05-01 DIAGNOSIS — Z7982 Long term (current) use of aspirin: Secondary | ICD-10-CM | POA: Diagnosis not present

## 2018-05-01 DIAGNOSIS — Z85828 Personal history of other malignant neoplasm of skin: Secondary | ICD-10-CM

## 2018-05-01 HISTORY — PX: TOTAL SHOULDER ARTHROPLASTY: SHX126

## 2018-05-01 LAB — URINE DRUG SCREEN, QUALITATIVE (ARMC ONLY)
Amphetamines, Ur Screen: NOT DETECTED
BARBITURATES, UR SCREEN: NOT DETECTED
Benzodiazepine, Ur Scrn: NOT DETECTED
COCAINE METABOLITE, UR ~~LOC~~: NOT DETECTED
Cannabinoid 50 Ng, Ur ~~LOC~~: POSITIVE — AB
MDMA (ECSTASY) UR SCREEN: NOT DETECTED
METHADONE SCREEN, URINE: NOT DETECTED
Opiate, Ur Screen: NOT DETECTED
Phencyclidine (PCP) Ur S: NOT DETECTED
TRICYCLIC, UR SCREEN: NOT DETECTED

## 2018-05-01 LAB — POCT I-STAT 4, (NA,K, GLUC, HGB,HCT)
GLUCOSE: 96 mg/dL (ref 70–99)
HCT: 37 % (ref 36.0–46.0)
Hemoglobin: 12.6 g/dL (ref 12.0–15.0)
POTASSIUM: 3.7 mmol/L (ref 3.5–5.1)
SODIUM: 140 mmol/L (ref 135–145)

## 2018-05-01 SURGERY — ARTHROPLASTY, SHOULDER, TOTAL
Anesthesia: Regional | Site: Shoulder | Laterality: Left

## 2018-05-01 MED ORDER — ROCURONIUM BROMIDE 100 MG/10ML IV SOLN
INTRAVENOUS | Status: DC | PRN
  Administered 2018-05-01: 80 mg via INTRAVENOUS
  Administered 2018-05-01: 20 mg via INTRAVENOUS

## 2018-05-01 MED ORDER — FENTANYL CITRATE (PF) 100 MCG/2ML IJ SOLN
INTRAMUSCULAR | Status: DC | PRN
  Administered 2018-05-01: 25 ug via INTRAVENOUS
  Administered 2018-05-01: 50 ug via INTRAVENOUS

## 2018-05-01 MED ORDER — FENTANYL CITRATE (PF) 100 MCG/2ML IJ SOLN
INTRAMUSCULAR | Status: AC
  Filled 2018-05-01: qty 2

## 2018-05-01 MED ORDER — BACITRACIN 50000 UNITS IM SOLR
INTRAMUSCULAR | Status: AC
  Filled 2018-05-01: qty 1

## 2018-05-01 MED ORDER — SODIUM CHLORIDE FLUSH 0.9 % IV SOLN
INTRAVENOUS | Status: AC
  Filled 2018-05-01: qty 10

## 2018-05-01 MED ORDER — DEXAMETHASONE SODIUM PHOSPHATE 4 MG/ML IJ SOLN
INTRAMUSCULAR | Status: DC | PRN
  Administered 2018-05-01: 6 mg via INTRAVENOUS

## 2018-05-01 MED ORDER — CHLORHEXIDINE GLUCONATE 4 % EX LIQD: 60.0000 mL | Freq: Once | CUTANEOUS | Status: DC

## 2018-05-01 MED ORDER — DOCUSATE SODIUM 100 MG PO CAPS
100.0000 mg | ORAL_CAPSULE | Freq: Two times a day (BID) | ORAL | Status: DC
  Filled 2018-05-01 (×2): qty 1

## 2018-05-01 MED ORDER — PHENOL 1.4 % MT LIQD
1.0000 | OROMUCOSAL | Status: DC | PRN
  Filled 2018-05-01: qty 177

## 2018-05-01 MED ORDER — BUPIVACAINE LIPOSOME 1.3 % IJ SUSP
INTRAMUSCULAR | Status: AC
  Filled 2018-05-01: qty 20

## 2018-05-01 MED ORDER — ONDANSETRON HCL 4 MG/2ML IJ SOLN: 4.0000 mg | Freq: Four times a day (QID) | INTRAMUSCULAR | Status: DC | PRN

## 2018-05-01 MED ORDER — MIDAZOLAM HCL 2 MG/2ML IJ SOLN
INTRAMUSCULAR | Status: AC
  Filled 2018-05-01: qty 2

## 2018-05-01 MED ORDER — CEFAZOLIN SODIUM-DEXTROSE 2-4 GM/100ML-% IV SOLN
2.0000 g | Freq: Four times a day (QID) | INTRAVENOUS | Status: AC
  Administered 2018-05-01 – 2018-05-02 (×3): 2 g via INTRAVENOUS
  Filled 2018-05-01 (×3): qty 100

## 2018-05-01 MED ORDER — FENTANYL CITRATE (PF) 100 MCG/2ML IJ SOLN
50.0000 ug | Freq: Once | INTRAMUSCULAR | Status: AC
  Administered 2018-05-01: 50 ug via INTRAVENOUS

## 2018-05-01 MED ORDER — ACETAMINOPHEN 325 MG PO TABS: 325.0000 mg | ORAL_TABLET | Freq: Four times a day (QID) | ORAL | Status: DC | PRN

## 2018-05-01 MED ORDER — EPHEDRINE SULFATE 50 MG/ML IJ SOLN
INTRAMUSCULAR | Status: DC | PRN
  Administered 2018-05-01: 10 mg via INTRAVENOUS
  Administered 2018-05-01: 5 mg via INTRAVENOUS
  Administered 2018-05-01: 10 mg via INTRAVENOUS
  Administered 2018-05-01: 5 mg via INTRAVENOUS

## 2018-05-01 MED ORDER — BISACODYL 10 MG RE SUPP: 10.0000 mg | Freq: Every day | RECTAL | Status: DC | PRN

## 2018-05-01 MED ORDER — ROCURONIUM BROMIDE 50 MG/5ML IV SOLN
INTRAVENOUS | Status: AC
  Filled 2018-05-01: qty 2

## 2018-05-01 MED ORDER — FENTANYL CITRATE (PF) 250 MCG/5ML IJ SOLN
INTRAMUSCULAR | Status: AC
  Filled 2018-05-01: qty 5

## 2018-05-01 MED ORDER — CALCIUM-MAGNESIUM-ZINC 333-133-5 MG PO TABS: ORAL_TABLET | Freq: Two times a day (BID) | ORAL | Status: DC

## 2018-05-01 MED ORDER — ACETAMINOPHEN 500 MG PO TABS
1000.0000 mg | ORAL_TABLET | Freq: Four times a day (QID) | ORAL | Status: AC
  Administered 2018-05-01 – 2018-05-02 (×3): 1000 mg via ORAL
  Filled 2018-05-01 (×4): qty 2

## 2018-05-01 MED ORDER — LIDOCAINE HCL (PF) 2 % IJ SOLN
INTRAMUSCULAR | Status: AC
  Filled 2018-05-01: qty 10

## 2018-05-01 MED ORDER — FAMOTIDINE 20 MG PO TABS
ORAL_TABLET | ORAL | Status: AC
  Filled 2018-05-01: qty 1

## 2018-05-01 MED ORDER — OXYCODONE HCL 5 MG PO TABS
5.0000 mg | ORAL_TABLET | ORAL | Status: DC | PRN
  Administered 2018-05-01: 10 mg via ORAL
  Filled 2018-05-01: qty 2

## 2018-05-01 MED ORDER — FAMOTIDINE 20 MG PO TABS
20.0000 mg | ORAL_TABLET | Freq: Once | ORAL | Status: AC
  Administered 2018-05-01: 20 mg via ORAL

## 2018-05-01 MED ORDER — SODIUM CHLORIDE 0.9 % IR SOLN
Status: DC | PRN
  Administered 2018-05-01: 11:00:00

## 2018-05-01 MED ORDER — ASPIRIN EC 325 MG PO TBEC
325.0000 mg | DELAYED_RELEASE_TABLET | Freq: Every day | ORAL | Status: DC
  Administered 2018-05-02: 325 mg via ORAL
  Filled 2018-05-01: qty 1

## 2018-05-01 MED ORDER — METOCLOPRAMIDE HCL 5 MG/ML IJ SOLN: 5.0000 mg | Freq: Three times a day (TID) | INTRAMUSCULAR | Status: DC | PRN

## 2018-05-01 MED ORDER — MAGNESIUM CITRATE PO SOLN
1.0000 | Freq: Once | ORAL | Status: DC | PRN
  Filled 2018-05-01: qty 296

## 2018-05-01 MED ORDER — LIDOCAINE HCL (CARDIAC) PF 100 MG/5ML IV SOSY
PREFILLED_SYRINGE | INTRAVENOUS | Status: DC | PRN
  Administered 2018-05-01: 100 mg via INTRAVENOUS

## 2018-05-01 MED ORDER — OXYCODONE HCL 5 MG PO TABS: 10.0000 mg | ORAL_TABLET | ORAL | Status: DC | PRN

## 2018-05-01 MED ORDER — ONDANSETRON HCL 4 MG PO TABS: 4.0000 mg | ORAL_TABLET | Freq: Four times a day (QID) | ORAL | Status: DC | PRN

## 2018-05-01 MED ORDER — LAMOTRIGINE 25 MG PO TABS
200.0000 mg | ORAL_TABLET | Freq: Two times a day (BID) | ORAL | Status: DC
  Administered 2018-05-01 – 2018-05-02 (×2): 200 mg via ORAL
  Filled 2018-05-01 (×2): qty 8

## 2018-05-01 MED ORDER — SUGAMMADEX SODIUM 200 MG/2ML IV SOLN
INTRAVENOUS | Status: DC | PRN
  Administered 2018-05-01: 200 mg via INTRAVENOUS

## 2018-05-01 MED ORDER — GLYCOPYRROLATE 0.2 MG/ML IJ SOLN
INTRAMUSCULAR | Status: DC | PRN
  Administered 2018-05-01: 0.2 mg via INTRAVENOUS

## 2018-05-01 MED ORDER — LACTATED RINGERS IV SOLN
INTRAVENOUS | Status: DC
  Administered 2018-05-01: 16:00:00 via INTRAVENOUS

## 2018-05-01 MED ORDER — METOCLOPRAMIDE HCL 10 MG PO TABS: 5.0000 mg | ORAL_TABLET | Freq: Three times a day (TID) | ORAL | Status: DC | PRN

## 2018-05-01 MED ORDER — MODAFINIL 200 MG PO TABS: 200.0000 mg | ORAL_TABLET | Freq: Every day | ORAL | Status: DC

## 2018-05-01 MED ORDER — BUPIVACAINE HCL (PF) 0.5 % IJ SOLN
INTRAMUSCULAR | Status: AC
  Filled 2018-05-01: qty 10

## 2018-05-01 MED ORDER — HYDROMORPHONE HCL 1 MG/ML IJ SOLN: 0.2500 mg | INTRAMUSCULAR | Status: DC | PRN

## 2018-05-01 MED ORDER — ALBUMIN HUMAN 5 % IV SOLN
INTRAVENOUS | Status: DC | PRN
  Administered 2018-05-01: 12:00:00 via INTRAVENOUS

## 2018-05-01 MED ORDER — ALBUMIN HUMAN 5 % IV SOLN
INTRAVENOUS | Status: AC
  Filled 2018-05-01: qty 250

## 2018-05-01 MED ORDER — PRAVASTATIN SODIUM 20 MG PO TABS
20.0000 mg | ORAL_TABLET | Freq: Every day | ORAL | Status: DC
  Administered 2018-05-01: 20 mg via ORAL
  Filled 2018-05-01: qty 1

## 2018-05-01 MED ORDER — MENTHOL 3 MG MT LOZG
1.0000 | LOZENGE | OROMUCOSAL | Status: DC | PRN
  Filled 2018-05-01: qty 9

## 2018-05-01 MED ORDER — TRAMADOL HCL 50 MG PO TABS
50.0000 mg | ORAL_TABLET | Freq: Four times a day (QID) | ORAL | Status: DC
  Administered 2018-05-01 – 2018-05-02 (×3): 50 mg via ORAL
  Filled 2018-05-01 (×3): qty 1

## 2018-05-01 MED ORDER — SODIUM CHLORIDE 0.9 % IV SOLN
INTRAVENOUS | Status: DC | PRN
  Administered 2018-05-01: 24 ug/min via INTRAVENOUS

## 2018-05-01 MED ORDER — PROPOFOL 10 MG/ML IV BOLUS
INTRAVENOUS | Status: AC
  Filled 2018-05-01: qty 20

## 2018-05-01 MED ORDER — LACTATED RINGERS IV SOLN
INTRAVENOUS | Status: DC
  Administered 2018-05-01 (×2): via INTRAVENOUS

## 2018-05-01 MED ORDER — CEFAZOLIN SODIUM-DEXTROSE 2-4 GM/100ML-% IV SOLN
INTRAVENOUS | Status: AC
  Filled 2018-05-01: qty 100

## 2018-05-01 MED ORDER — POLYETHYLENE GLYCOL 3350 17 G PO PACK: 17.0000 g | PACK | Freq: Every day | ORAL | Status: DC | PRN

## 2018-05-01 MED ORDER — LIDOCAINE HCL (PF) 1 % IJ SOLN
INTRAMUSCULAR | Status: AC
  Filled 2018-05-01: qty 5

## 2018-05-01 MED ORDER — MIDAZOLAM HCL 2 MG/2ML IJ SOLN
INTRAMUSCULAR | Status: DC | PRN
  Administered 2018-05-01: 2 mg via INTRAVENOUS

## 2018-05-01 MED ORDER — VASOPRESSIN 20 UNIT/ML IV SOLN
INTRAVENOUS | Status: DC | PRN
  Administered 2018-05-01 (×2): 1 [IU] via INTRAVENOUS

## 2018-05-01 MED ORDER — BUPIVACAINE-EPINEPHRINE (PF) 0.25% -1:200000 IJ SOLN
INTRAMUSCULAR | Status: AC
  Filled 2018-05-01: qty 30

## 2018-05-01 MED ORDER — KETOROLAC TROMETHAMINE 15 MG/ML IJ SOLN
7.5000 mg | Freq: Four times a day (QID) | INTRAMUSCULAR | Status: AC
  Administered 2018-05-01 – 2018-05-02 (×3): 7.5 mg via INTRAVENOUS
  Filled 2018-05-01 (×4): qty 1

## 2018-05-01 MED ORDER — PHENYLEPHRINE HCL 10 MG/ML IJ SOLN
INTRAMUSCULAR | Status: DC | PRN
  Administered 2018-05-01: 100 ug via INTRAVENOUS

## 2018-05-01 MED ORDER — PROPOFOL 10 MG/ML IV BOLUS
INTRAVENOUS | Status: DC | PRN
  Administered 2018-05-01: 20 mg via INTRAVENOUS
  Administered 2018-05-01: 30 mg via INTRAVENOUS
  Administered 2018-05-01: 150 mg via INTRAVENOUS

## 2018-05-01 MED ORDER — MIDAZOLAM HCL 2 MG/2ML IJ SOLN
1.0000 mg | Freq: Once | INTRAMUSCULAR | Status: AC
  Administered 2018-05-01: 1 mg via INTRAVENOUS

## 2018-05-01 MED ORDER — ONDANSETRON HCL 4 MG/2ML IJ SOLN
INTRAMUSCULAR | Status: DC | PRN
  Administered 2018-05-01: 4 mg via INTRAVENOUS

## 2018-05-01 MED ORDER — AMLODIPINE BESYLATE 10 MG PO TABS
10.0000 mg | ORAL_TABLET | Freq: Every day | ORAL | Status: DC
  Administered 2018-05-02: 10 mg via ORAL
  Filled 2018-05-01: qty 1

## 2018-05-01 MED ORDER — DULOXETINE HCL 60 MG PO CPEP
60.0000 mg | ORAL_CAPSULE | Freq: Every day | ORAL | Status: DC
  Administered 2018-05-01: 60 mg via ORAL
  Filled 2018-05-01 (×2): qty 1

## 2018-05-01 MED ORDER — VITAMIN D 1000 UNITS PO TABS
2000.0000 [IU] | ORAL_TABLET | Freq: Every day | ORAL | Status: DC
  Filled 2018-05-01: qty 2

## 2018-05-01 MED ORDER — HYDROMORPHONE HCL 1 MG/ML IJ SOLN: 0.5000 mg | INTRAMUSCULAR | Status: DC | PRN

## 2018-05-01 MED ORDER — GABAPENTIN 100 MG PO CAPS
200.0000 mg | ORAL_CAPSULE | Freq: Two times a day (BID) | ORAL | Status: DC
  Administered 2018-05-01 – 2018-05-02 (×2): 200 mg via ORAL
  Filled 2018-05-01 (×2): qty 2

## 2018-05-01 SURGICAL SUPPLY — 75 items
BLADE BOVIE TIP EXT 4 (BLADE) ×3 IMPLANT
BLADE SAW 1 (BLADE) ×3 IMPLANT
BLADE SAW 90X25X1.19 OSCILLAT (BLADE) ×2 IMPLANT
BNDG COHESIVE 4X5 TAN STRL (GAUZE/BANDAGES/DRESSINGS) ×3 IMPLANT
BODY ANATOMIC PROXIMAL SZ10 (Shoulder) ×2 IMPLANT
BOWL CEMENT MIX W/ADAPTER (MISCELLANEOUS) ×3 IMPLANT
BRUSH SCRUB EZ  4% CHG (MISCELLANEOUS) ×2
BRUSH SCRUB EZ 4% CHG (MISCELLANEOUS) ×2 IMPLANT
CANISTER SUCT 1200ML W/VALVE (MISCELLANEOUS) ×3 IMPLANT
CANISTER SUCT 3000ML PPV (MISCELLANEOUS) ×6 IMPLANT
CEMENT BONE 1-PACK (Cement) ×3 IMPLANT
CHLORAPREP W/TINT 26ML (MISCELLANEOUS) ×6 IMPLANT
CLOSURE WOUND 1/2 X4 (GAUZE/BANDAGES/DRESSINGS)
COVER MAYO STAND STRL (DRAPES) ×3 IMPLANT
COVER WAND RF STERILE (DRAPES) ×3 IMPLANT
CRADLE LAMINECT ARM (MISCELLANEOUS) ×6 IMPLANT
DRAPE IMP U-DRAPE 54X76 (DRAPES) ×6 IMPLANT
DRAPE INCISE IOBAN 66X45 STRL (DRAPES) ×3 IMPLANT
DRAPE INCISE IOBAN 66X60 STRL (DRAPES) ×3 IMPLANT
DRAPE SHEET LG 3/4 BI-LAMINATE (DRAPES) ×6 IMPLANT
DRAPE STERI 35X30 U-POUCH (DRAPES) ×2 IMPLANT
DRAPE TABLE BACK 80X90 (DRAPES) ×3 IMPLANT
DRAPE U-SHAPE 47X51 STRL (DRAPES) ×5 IMPLANT
DRSG TEGADERM 6X8 (GAUZE/BANDAGES/DRESSINGS) ×5 IMPLANT
ELECT REM PT RETURN 9FT ADLT (ELECTROSURGICAL) ×3
ELECTRODE REM PT RTRN 9FT ADLT (ELECTROSURGICAL) ×1 IMPLANT
GAUZE PETRO XEROFOAM 1X8 (MISCELLANEOUS) ×3 IMPLANT
GAUZE SPONGE 4X4 12PLY STRL (GAUZE/BANDAGES/DRESSINGS) IMPLANT
GLENOID ANCHOR PEG CROSSLK 44 (Orthopedic Implant) ×2 IMPLANT
GLOVE BIO SURGEON STRL SZ7.5 (GLOVE) ×4 IMPLANT
GLOVE BIOGEL PI IND STRL 7.5 (GLOVE) IMPLANT
GLOVE BIOGEL PI INDICATOR 7.5 (GLOVE) ×8
GLOVE INDICATOR 8.0 STRL GRN (GLOVE) ×3 IMPLANT
GLOVE SURG ORTHO 8.0 STRL STRW (GLOVE) ×5 IMPLANT
GOWN STRL REUS W/ TWL LRG LVL3 (GOWN DISPOSABLE) ×2 IMPLANT
GOWN STRL REUS W/ TWL XL LVL3 (GOWN DISPOSABLE) ×1 IMPLANT
GOWN STRL REUS W/TWL LRG LVL3 (GOWN DISPOSABLE) ×6
GOWN STRL REUS W/TWL XL LVL3 (GOWN DISPOSABLE) ×2
HEAD HUMERAL GLOBAL STD 44X12 (Head) ×2 IMPLANT
HOOD PEEL AWAY FLYTE STAYCOOL (MISCELLANEOUS) ×9 IMPLANT
IV NS 1000ML (IV SOLUTION) ×2
IV NS 1000ML BAXH (IV SOLUTION) ×1 IMPLANT
KIT STABILIZATION SHOULDER (MISCELLANEOUS) ×3 IMPLANT
KIT TURNOVER KIT A (KITS) ×3 IMPLANT
MASK FACE SPIDER DISP (MASK) ×3 IMPLANT
MAT ABSORB  FLUID 56X50 GRAY (MISCELLANEOUS) ×2
MAT ABSORB FLUID 56X50 GRAY (MISCELLANEOUS) ×1 IMPLANT
NDL MAYO 6 CRC TAPER PT (NEEDLE) ×1 IMPLANT
NDL MAYO CATGUT SZ5 (NEEDLE)
NDL SAFETY ECLIPSE 18X1.5 (NEEDLE) IMPLANT
NDL SUT 5 .5 CRC TPR PNT MAYO (NEEDLE) IMPLANT
NEEDLE HYPO 18GX1.5 SHARP (NEEDLE)
NEEDLE MAYO 6 CRC TAPER PT (NEEDLE) ×3 IMPLANT
NS IRRIG 1000ML POUR BTL (IV SOLUTION) ×3 IMPLANT
PACK ARTHROSCOPY SHOULDER (MISCELLANEOUS) ×3 IMPLANT
PAD ABD DERMACEA PRESS 5X9 (GAUZE/BANDAGES/DRESSINGS) ×4 IMPLANT
PIN METAGLENE 2.5 (PIN) ×2 IMPLANT
PULSAVAC PLUS IRRIG FAN TIP (DISPOSABLE) ×3
SLING ARM LRG DEEP (SOFTGOODS) ×3 IMPLANT
SPONGE LAP 18X18 RF (DISPOSABLE) ×5 IMPLANT
STAPLER SKIN PROX 35W (STAPLE) ×3 IMPLANT
STEM STANDARD SZ 10 113MM (Stem) ×3 IMPLANT
STEM STD SZ 10 113MM (Stem) IMPLANT
STRAP SAFETY 5IN WIDE (MISCELLANEOUS) ×3 IMPLANT
STRIP CLOSURE SKIN 1/2X4 (GAUZE/BANDAGES/DRESSINGS) IMPLANT
SUT TICRON 2-0 30IN 311381 (SUTURE) IMPLANT
SUT VIC AB 0 CT2 27 (SUTURE) ×3 IMPLANT
SUT VIC AB 2-0 CT1 18 (SUTURE) ×6 IMPLANT
SUT VIC AB PLUS 45CM 1-MO-4 (SUTURE) IMPLANT
SYR 10ML LL (SYRINGE) ×3 IMPLANT
SYR TOOMEY 50ML (SYRINGE) ×3 IMPLANT
SYRINGE IRR TOOMEY STRL 70CC (SYRINGE) ×3 IMPLANT
TAPE SUT 30 1/2 CRC GREEN (SUTURE) ×6 IMPLANT
TIP FAN IRRIG PULSAVAC PLUS (DISPOSABLE) ×1 IMPLANT
WATER STERILE IRR 1000ML POUR (IV SOLUTION) ×2 IMPLANT

## 2018-05-01 NOTE — Transfer of Care (Signed)
Immediate Anesthesia Transfer of Care Note  Patient: [name]  Procedure(s) Performed: TOTAL SHOULDER ARTHROPLASTY (Left Shoulder)  Patient Location: PACU  Anesthesia Type:General  Level of Consciousness: drowsy  Airway & Oxygen Therapy: Patient Spontanous Breathing and Patient connected to nasal cannula oxygen  Post-op Assessment: Report given to RN and Post -op Vital signs reviewed and stable  Post vital signs: Reviewed and stable  Last Vitals:  Vitals Value Taken Time  BP    Temp    Pulse 70 05/01/2018  1:03 PM  Resp 12 05/01/2018  1:03 PM  SpO2 98 % 05/01/2018  1:03 PM  Vitals shown include unvalidated device data.  Last Pain:  Vitals:   05/01/18 0839  TempSrc: Tympanic  PainSc: 0-No pain         Complications: No apparent anesthesia complications

## 2018-05-01 NOTE — H&P (Signed)
The patient has been re-examined, and the chart reviewed, and there have been no interval changes to the documented history and physical.  Plan a left total shoulder today. ? ?Anesthesia is consulted regarding a peripheral nerve block for post-operative pain. ? ?The risks, benefits, and alternatives have been discussed at length, and the patient is willing to proceed.   ? ?

## 2018-05-01 NOTE — Anesthesia Procedure Notes (Signed)
Procedure Name: Intubation Date/Time: 05/01/2018 10:17 AM Performed by: Bernardo Heater, CRNA Pre-anesthesia Checklist: Patient identified, Emergency Drugs available, Suction available and Patient being monitored Patient Re-evaluated:Patient Re-evaluated prior to induction Oxygen Delivery Method: Circle system utilized Preoxygenation: Pre-oxygenation with 100% oxygen Induction Type: IV induction Laryngoscope Size: Mac and 3 Grade View: Grade I Tube size: 7.0 mm Number of attempts: 1 Placement Confirmation: ETT inserted through vocal cords under direct vision,  positive ETCO2 and breath sounds checked- equal and bilateral Secured at: 21 cm Tube secured with: Tape Dental Injury: Teeth and Oropharynx as per pre-operative assessment

## 2018-05-01 NOTE — Anesthesia Procedure Notes (Signed)
Anesthesia Regional Block: Interscalene brachial plexus block   Pre-Anesthetic Checklist: ,, timeout performed, Correct Patient, Correct Site, Correct Laterality, Correct Procedure, Correct Position, site marked, Risks and benefits discussed,  Surgical consent,  Pre-op evaluation,  At surgeon's request and post-op pain management  Laterality: Left  Prep: chloraprep       Needles:  Injection technique: Single-shot  Needle Type: Stimiplex          Additional Needles:   Procedures:,,,, ultrasound used (permanent image in chart),,,,  Narrative:  Start time: 05/01/2018 9:20 AM End time: 05/01/2018 9:30 AM  Performed by: Personally  Anesthesiologist: Durenda Hurt, MD
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
## 2018-05-01 NOTE — Op Note (Signed)
05/01/2018  12:55 PM  Patient:   [name]  Pre-Op Diagnosis:   Degenerative joint disease, left shoulder.  Post-Op Diagnosis:   Same.  Procedure:   Left total shoulder arthroplasty.  Surgeon:   Kurtis Bushman, MD  Assistant:  Carlynn Spry, PA-C  Anesthesia:   General endotracheal intubation with an interscalene block.  Findings:   As above. The rotator cuff was in satisfactory condition.  Complications:   None  EBL:  350 cc  Drains:   None  Closure:   Staples  Implants:   [employer] Unite system with a standard stem size 10 porocoat  With a 135 degree porocoat proximal body size 10, a 44 x 12 mm humeral head, and a cemented glenoid component size 44.  Brief Clinical Note:   The patient is a 67 year old female with a history of left shoulder pain. The patient presents at this time for a left total shoulder arthroplasty.  Procedure:   The patient was brought into the operating room and lain in the supine position on the OR table. After adequate IV sedation was achieved, an interscalene block was placed by the anesthesiologist. The patient then underwent general endotracheal intubation and anesthesia before being repositioned in the beach chair position using the beach chair positioner. A Foley catheter was placed by the nurse. The left shoulder and upper extremity were prepped with ChloraPrep solution before being draped sterilely. Preoperative antibiotics were administered. A standard anterior approach to the shoulder was made through an approximately 4-5 inch incision. The incision was carried down through the subcutaneous tissues to expose the deltopectoral fascia. The interval between the deltoid and pectoralis muscles was identified and this plane developed, retracting the cephalic vein laterally with the deltoid muscle. The conjoined tendon was identified. The lateral margin was dissected and the Kolbel self-retraining retractor inserted. The "three sisters" were identified and  cauterized. Bursal tissues were removed to improve visualization. The subscapularis tendon was released from its attachment to the lesser tuberosity 1 cm proximal to its insertion and several tagging sutures placed. The inferior capsule was released with care after identifying and protecting the axillary nerve. The proximal humeral cut was made at approximately 30 of retroversion using the extra-medullary guide.   Attention was directed to the glenoid. The labrum was debrided circumferentially before the center of the glenoid was marked with electrocautery. The small and medium sizers were positioned and it was elected to proceed with a small glenoid component. The guidewire was drilled into the glenoid neck using the appropriate guide. After verifying its position, the glenoid was lightly reamed with the butterfly reamer before the centralizing reamer was used. The small peripheral peg guide was positioned and each of the three pegs drilled sequentially, leaving a peg in place so as to minimize shifting of the guide. The bony surfaces were prepared for cementing by irrigating them thoroughly with bacitracin saline solution using the jet lavage system. Meanwhile, cement was mixed on the back table. When it was ready, some cement was injected into each of the three peg holes using a [place]y syringe and additional cement applied to the posterior aspect of the glenoid component. The component was impacted into place and the excess cement was removed. Pressure was maintained on the glenoid until the cement hardened.  Attention was directed to the humeral side. The humeral canal was reamed sequentially beginning with the end-cutting reamer then progressing up to a 10 mm reamer. This provided excellent circumferential chatter. The canal was  prepared using a brosteotome. A trial reduction performed. The arm demonstrated excellent range of motion as the hand could be brought across the chest to the opposite shoulder and  brought to the top of the patient's head and to the patient's ear. The shoulder remained stable throughout this range of motion, and was stable with abduction and external rotation. The joint was dislocated and the trial components removed. The final components were impacted into place with care taken to maintain the appropriate version. Again, the [facility] taper locking mechanism was verified using manual distraction. The shoulder was relocated and again placed through a range of motion with the findings as described above.  The wound was copiously irrigated with bacitracin saline solution using the jet lavage system. The subscapularis tendon was reapproximated using 52mm cottony Dacron sutures. The deltopectoral interval was closed using #0 Vicryl interrupted sutures before the subcutaneous tissues were closed using 2-0 Vicryl interrupted sutures. The skin was closed using staples. A sterile occlusive dressing was applied to the wound before the arm was placed into a sling. The patient was then transferred back to a hospital bed before being awakened, extubated, and returned to the recovery room in satisfactory condition after tolerating the procedure well.

## 2018-05-01 NOTE — Anesthesia Post-op Follow-up Note (Signed)
Anesthesia QCDR form completed.        

## 2018-05-02 ENCOUNTER — Encounter: Payer: Self-pay | Admitting: Orthopedic Surgery

## 2018-05-02 LAB — HEMOGLOBIN AND HEMATOCRIT, BLOOD
HEMATOCRIT: 32 % — AB (ref 36.0–46.0)
HEMOGLOBIN: 10.4 g/dL — AB (ref 12.0–15.0)

## 2018-05-02 MED ORDER — OXYCODONE HCL 5 MG PO TABS: 5.0000 mg | ORAL_TABLET | ORAL | 0 refills | Status: DC | PRN

## 2018-05-02 NOTE — Discharge Summary (Signed)
Physician Discharge Summary  Patient ID: [name] MRN: [id number] DOB/AGE: 1951-01-04 67 y.o.  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 Anginal pain ([place]) 2019   stable but patient denies.  . Arthritis   . Bipolar disorder ([place])   . Cancer ([place]) 12/2017   precancerous spot burnt off of face recently  . Chronic kidney disease 2012   mrsa in kidneys caused coma and a trip to the icu  . Depression   . Diverticulosis   . Dysrhythmia 12/2017   1st degree AV block. followed by dr. Nehemiah Massed  . Fibromyalgia   . Heart murmur 2019   followed by dr. Nehemiah Massed  . History of kidney infection   . History of MRSA infection 2011   kidney  . Hypercholesterolemia   . Hypertension   . Leg wound, right 12/2017   draining sore . provided with antibiotics  . S/P arthroscopy of right shoulder 11/2010  . Spinal stenosis of lumbosacral region     Surgeries: Procedure(s): TOTAL SHOULDER ARTHROPLASTY on 05/01/2018   Consultants (if any):   Discharged Condition: Improved  Hospital Course: [name] is an 67 y.o. 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    She was given perioperative antibiotics:  Anti-infectives (From admission, onward)   Start     Dose/Rate Route Frequency Ordered Stop   05/01/18 1445  ceFAZolin (ANCEF) IVPB 2g/100 mL premix     2 g 200 mL/hr over 30 Minutes Intravenous Every 6 hours 05/01/18 1435 05/02/18 0400   05/01/18 1055  50,000 units bacitracin in 0.9% normal saline 250 mL  irrigation  Status:  Discontinued       As needed 05/01/18 1107 05/01/18 1258   05/01/18 0803  ceFAZolin (ANCEF) 2-4 GM/100ML-% IVPB    Note to Pharmacy:  Veryl Speak   : cabinet override      05/01/18 0803 05/01/18 1033   05/01/18 0600  ceFAZolin (ANCEF) IVPB 2g/100 mL premix     2 g 200 mL/hr over 30 Minutes Intravenous On call to O.R. 04/30/18 2258 05/01/18 1045    .  She was given sequential compression devices, early ambulation, and ASA for DVT prophylaxis.  She benefited maximally from the hospital stay and there were no complications.    Recent vital signs:  Vitals:   05/01/18 2343 05/02/18 0729  BP: (!) 145/70 (!) 150/63  Pulse: 66 (!) 59  Resp: 20   Temp: 98.1 F (36.7 C) 97.8 F (36.6 C)  SpO2: 94% 98%    Recent laboratory studies:  Lab Results  Component Value Date   HGB 10.4 (L) 05/02/2018   HGB 12.6 05/01/2018   HGB 13.7 04/17/2018   Lab Results  Component Value Date   WBC 3.8 (L) 04/17/2018   PLT 220 04/17/2018   Lab Results  Component Value Date   INR 0.99 04/17/2018   Lab Results  Component Value Date   NA 140 05/01/2018   K 3.7 05/01/2018   CL 101 04/17/2018   CO2 32 04/17/2018   BUN 16 04/17/2018   CREATININE 0.80 04/17/2018  GLUCOSE 96 05/01/2018    Discharge Medications:   Allergies as of 05/02/2018      Reactions   Other Other (See Comments)   Eggplant - throat itches    Effexor [venlafaxine] Hives   Wellbutrin [bupropion] Hives      Medication List    TAKE these medications   amLODipine 10 MG tablet Commonly known as:  NORVASC Take 10 mg by mouth daily.   aspirin EC 81 MG tablet Take 81 mg by mouth every other day.   buprenorphine 2 MG Subl SL tablet Commonly known as:  SUBUTEX Place 2 mg under the tongue 2 (two) times daily as needed (for pain).   CALCIUM-MAGNESIUM-ZINC PO Take 1 tablet by mouth 2 (two) times daily.   cholecalciferol 1000 units tablet Commonly known as:  VITAMIN D Take 2,000 Units by mouth  daily.   DULoxetine 60 MG capsule Commonly known as:  CYMBALTA Take 60 mg by mouth at bedtime.   Fish Oil 1000 MG Caps Take 1,000 mg by mouth at bedtime.   gabapentin 100 MG capsule Commonly known as:  NEURONTIN Take 200 mg by mouth 2 (two) times daily.   GLUCOS-CHONDROIT-MSM COMPLEX Tabs Take 1 tablet by mouth 2 (two) times daily.   lamoTRIgine 200 MG tablet Commonly known as:  LAMICTAL Take 200 mg by mouth 2 (two) times daily.   lovastatin 20 MG tablet Commonly known as:  MEVACOR Take 20 mg by mouth at bedtime.   meloxicam 7.5 MG tablet Commonly known as:  MOBIC Take 7.5-15 mg by mouth daily as needed for pain.   modafinil 200 MG tablet Commonly known as:  PROVIGIL Take 200 mg by mouth daily.   NARCAN 4 MG/0.1ML Liqd nasal spray kit Generic drug:  naloxone Place 1 spray into the nose once.   OVER THE COUNTER MEDICATION Take 1 tablet by mouth 2 (two) times daily. Cognium Supplement for brain health   oxyCODONE 5 MG immediate release tablet Commonly known as:  Oxy IR/ROXICODONE Take 1 tablet (5 mg total) by mouth every 4 (four) hours as needed for moderate pain (pain score 4-6).   PRESERVISION AREDS 2 PO Take 1 capsule by mouth 2 (two) times daily.       Diagnostic Studies: Dg Shoulder Left Port  Result Date: 05/01/2018 CLINICAL DATA:  Postop left shoulder EXAM: LEFT SHOULDER - 1 VIEW COMPARISON:  12/24/2017 CT FINDINGS: Two views of the left shoulder status post left shoulder arthroplasty. The uncemented humeral component is unremarkable. Postoperative complication or joint dislocation is identified. Overlying skin staples are in place. Changes of prior ACDF noted of the included lower cervical spine. IMPRESSION: Left shoulder arthroplasty without complicating features noted. Electronically Signed   By: [name] M.D.   On: 05/01/2018 15:03    Disposition: Discharge disposition: 01-Home or Self Care            Signed: Lovell Espinoza  ,MD 05/02/2018, 8:16 AM

## 2018-05-02 NOTE — Progress Notes (Signed)
Pt ready for discharge home today per MD. Patient assessment unchanged from this morning. Honeycomb dressing applied to left shoulder per order from Dr. Harlow Mares. Reviewed discharge instructions and prescriptions with pt; all questions answered and pt verbalized understanding. PIV removed, VSS. Pt waiting for ride.   [name]

## 2018-05-02 NOTE — Discharge Instructions (Signed)
Wear sling at all times, including sleep.  May remove for hygiene purposes.  You will need to use the sling for a total of 4 weeks following surgery.  Do not try and lift your arm up or away from your body for any reason.   Keep the dressing dry.  You may remove bandage in 3 days.  You may place Band-Aid over top of the incision.  May shower once dressing is removed in 3 days.  Remove sling carefully only for showers, leaving arm down by your side while in the shower.  +++ Make sure to take some pain medication this evening before you fall asleep, in preparation for the nerve block wearing off in the middle of the night.  If the the pain medication causes itching, or is too strong, try taking a single tablet at a time, or combining with Benadryl.  You may be most comfortable sleeping in a recliner.  If you do sleep in near bed, placed pillows behind the shoulder that have the operation to support it.

## 2018-05-02 NOTE — Anesthesia Postprocedure Evaluation (Signed)
Anesthesia Post Note  Patient: [name]  Procedure(s) Performed: TOTAL SHOULDER ARTHROPLASTY (Left Shoulder)  Patient location during evaluation: PACU Anesthesia Type: Regional and General Level of consciousness: awake and alert Pain management: pain level controlled Vital Signs Assessment: post-procedure vital signs reviewed and stable Respiratory status: spontaneous breathing, nonlabored ventilation, respiratory function stable and patient connected to nasal cannula oxygen Cardiovascular status: blood pressure returned to baseline and stable Postop Assessment: no apparent nausea or vomiting Anesthetic complications: no     Last Vitals:  Vitals:   05/01/18 2343 05/02/18 0729  BP: (!) 145/70 (!) 150/63  Pulse: 66 (!) 59  Resp: 20   Temp: 36.7 C 36.6 C  SpO2: 94% 98%    Last Pain:  Vitals:   05/02/18 0729  TempSrc: Oral  PainSc:                  Durenda Hurt

## 2018-05-02 NOTE — Evaluation (Signed)
Occupational Therapy Evaluation Patient Details Name: [name] MRN: [id number] DOB: 1950-11-14 Today's Date: 05/02/2018    History of Present Illness Presently, Pt had L TKA. PMH: HTN, sleep apnea, dysrhythmia, anginal pain, depression, bipolar, CRF, fibromyalgia, BUE shoulder surgeries, chronic kidney disease, cervical lamenetomy   Clinical Impression   Pt seen for OT evaluation this date. Pt is a 67 y/o female who presented with L total shoulder arthroplasty. Prior to admission, pt enjoyed acting, being a Cytogeneticist and enjoying activities with her dog. Pt was completely independent with all ADLs and mobility prior to surgery. Pt has had BUE shoulder surgeries in the past and is very familiar with her precautions. OT reviewed precautions and provided handout. Although pt has limitations with ROM and strength in her L shoulder, she has assistance from friends and neighbors whenever necessary. Pt educated in sling mgt, positioning, one arm dressing and one arm techniques for applying deoderant and washing LUE. Pt verbalized understanding of all education presented at this time. No further skilled OT services are required at this time. OT recommends sending pt home at discharge. Will sign off.     Follow Up Recommendations  No OT follow up    Equipment Recommendations  None recommended by OT    Recommendations for Other Services       Precautions / Restrictions Precautions Precautions: Shoulder Type of Shoulder Precautions: ULE NWBing, no shoulder ROM Shoulder Interventions: Shoulder sling/immobilizer;Off for dressing/bathing/exercises Precaution Booklet Issued: Yes (comment) Required Braces or Orthoses: Sling Restrictions Weight Bearing Restrictions: Yes LUE Weight Bearing: Non weight bearing      Mobility Bed Mobility Overal bed mobility: Independent                Transfers Overall transfer level: Independent                    Balance Overall  balance assessment: Independent                                         ADL either performed or assessed with clinical judgement   ADL Overall ADL's : Needs assistance/impaired Eating/Feeding: Independent   Grooming: Modified independent;Standing   Upper Body Bathing: Modified independent;Standing   Lower Body Bathing: Modified independent;Sit to/from stand   Upper Body Dressing : Modified independent;Sitting Upper Body Dressing Details (indicate cue type and reason): additional time and effort required;  Due to prior surgeries pt moved quickly and VC required to maintain precautions. Lower Body Dressing: Sitting/lateral leans;Modified independent   Toilet Transfer: Independent           Functional mobility during ADLs: Independent       Vision Baseline Vision/History: Wears glasses Wears Glasses: At all times Patient Visual Report: No change from baseline       Perception     Praxis      Pertinent Vitals/Pain Pain Assessment: No/denies pain     Hand Dominance     Extremity/Trunk Assessment Upper Extremity Assessment Upper Extremity Assessment: LUE deficits/detail LUE: Unable to fully assess due to immobilization   Lower Extremity Assessment Lower Extremity Assessment: Overall WFL for tasks assessed       Communication Communication Communication: No difficulties   Cognition Arousal/Alertness: Awake/alert Behavior During Therapy: WFL for tasks assessed/performed Overall Cognitive Status: Within Functional Limits for tasks assessed  General Comments       Exercises Other Exercises Other Exercises: Pt educated in shoulder precautions including sling mgt including donning/doffing, wear schedule and positioning. Other Exercises: Pt educated in positioning of LUE for sleep and sitting,  Other Exercises: Pt educated in one arm dressing.  Other Exercises: Pt educated in way to don  deoderant and wash LUE.    Shoulder Instructions      Home Living Family/patient expects to be discharged to:: Private residence Living Arrangements: Alone Available Help at Discharge: Friend(s);Neighbor;Available 24 hours/day Type of Home: House       Home Layout: One level     Bathroom Shower/Tub: Walk-in shower(stool in the shower)   Bathroom Toilet: Handicapped height Bathroom Accessibility: Yes   Home Equipment: (Long handled shoe horn)          Prior Functioning/Environment Level of Independence: Independent        Comments: Pt independent will all ADLs and mobility. Pt currently works as an Health and safety inspector. Pt has comfort dog with her in the hospital. Pt had one fall due to pain.         OT Problem List:        OT Treatment/Interventions:      OT Goals(Current goals can be found in the care plan section) Acute Rehab OT Goals Patient Stated Goal: to go home OT Goal Formulation: All assessment and education complete, DC therapy Potential to Achieve Goals: Good  OT Frequency:     Barriers to D/C:            Co-evaluation              AM-PAC PT "6 Clicks" Daily Activity     Outcome Measure Help from another person eating meals?: None Help from another person taking care of personal grooming?: None Help from another person toileting, which includes using toliet, bedpan, or urinal?: None Help from another person bathing (including washing, rinsing, drying)?: None Help from another person to put on and taking off regular upper body clothing?: None Help from another person to put on and taking off regular lower body clothing?: None 6 Click Score: 24   End of Session    Activity Tolerance: Patient tolerated treatment well Patient left: in bed;with bed alarm set  OT Visit Diagnosis: Muscle weakness (generalized) (M62.81)                Time: 0211-1735 OT Time Calculation (min): 30 min Charges:     [name]  OTS  05/02/2018, 12:26 PM

## 2018-05-04 LAB — SURGICAL PATHOLOGY

## 2019-10-23 ENCOUNTER — Other Ambulatory Visit: Payer: Self-pay | Admitting: Family Medicine

## 2019-10-23 DIAGNOSIS — Z1231 Encounter for screening mammogram for malignant neoplasm of breast: Secondary | ICD-10-CM

## 2019-10-23 DIAGNOSIS — Z780 Asymptomatic menopausal state: Secondary | ICD-10-CM

## 2019-11-26 ENCOUNTER — Ambulatory Visit
Admission: RE | Admit: 2019-11-26 | Discharge: 2019-11-26 | Disposition: A | Discharge status: Home / Self Care | Payer: Medicare PPO | Source: Ambulatory Visit | Attending: Family Medicine | Admitting: Family Medicine

## 2019-11-26 DIAGNOSIS — Z780 Asymptomatic menopausal state: Secondary | ICD-10-CM | POA: Diagnosis not present

## 2019-12-22 ENCOUNTER — Other Ambulatory Visit: Payer: Self-pay

## 2019-12-22 ENCOUNTER — Ambulatory Visit (INDEPENDENT_AMBULATORY_CARE_PROVIDER_SITE_OTHER): Payer: Medicare PPO | Admitting: Licensed Clinical Social Worker

## 2019-12-22 DIAGNOSIS — F3181 Bipolar II disorder: Secondary | ICD-10-CM | POA: Diagnosis not present

## 2019-12-22 NOTE — Progress Notes (Signed)
Virtual Visit via Video Note  I connected with [name] on 12/22/19 at  2:30 PM EDT by a video enabled telemedicine application and verified that I am speaking with the correct person using two identifiers.  Location: Patient: home Provider: [name]   I discussed the limitations of evaluation and management by telemedicine and the availability of in person appointments. The patient expressed understanding and agreed to proceed.   I discussed the assessment and treatment plan with the patient. The patient was provided an opportunity to ask questions and all were answered. The patient agreed with the plan and demonstrated an understanding of the instructions.   The patient was advised to call back or seek an in-person evaluation if the symptoms worsen or if the condition fails to improve as anticipated.  I provided 60 minutes of non-face-to-face time during this encounter.   Rachel Bo Cyani Kallstrom, LCSW    Comprehensive Clinical Assessment (CCA) Note  12/22/2019 [name] [id number]  Visit Diagnosis:      ICD-10-CM   1. Bipolar II disorder, most recent episode major depressive ([place])  F31.81      CCA Biopsychosocial  Intake/Chief Complaint:  CCA Intake With Chief Complaint CCA Part Two Date: 12/22/19 CCA Part Two Time: 0230 Chief Complaint/Presenting Problem: depression, anxiety--low energy and motivaiton Patient's Currently Reported Symptoms/Problems: low energy, motivation Individual's Strengths: community support; great friend Ecologist; Patent attorney; good motivation to change; good self-awareness Individual's Abilities: loves theater Type of Services Patient Feels Are Needed: counseling (pt has psychiatrist/med management)  Mental Health Symptoms Depression:  Depression: Change in energy/activity, Sleep (too much or little), Increase/decrease in appetite, Hopelessness, Fatigue, Difficulty Concentrating, Worthlessness, Duration of symptoms greater than two weeks  Mania:      Anxiety:   Anxiety: Tension, Worrying, Difficulty concentrating (used to get massages/chiropractic treatments regularly)  Psychosis:  Psychosis: None  Trauma:     Obsessions:  Obsessions: Cause anxiety  Compulsions:  Compulsions: "Driven" to perform behaviors/acts (counts to "7" for all things)  Inattention:  Inattention: Symptoms before age 48, Avoids/dislikes activities that require focus  Hyperactivity/Impulsivity:  Hyperactivity/Impulsivity: Symptoms present before age 80, Runs and climbs, Fidgets with hands/feet, Hard time playing/leisure activities quietly, Feeling of restlessness, Difficulty waiting turn (hyperactive as a child....moody and depressed as a child)  Oppositional/Defiant Behaviors:  Oppositional/Defiant Behaviors: None  Emotional Irregularity:  Emotional Irregularity: Mood lability  Other Mood/Personality Symptoms:      Mental Status Exam Appearance and self-care  Stature:  Stature: Average  Weight:  Weight: Overweight  Clothing:  Clothing: Neat/clean  Grooming:  Grooming: Normal  Cosmetic use:  Cosmetic Use: None  Posture/gait:  Posture/Gait: Normal  Motor activity:  Motor Activity: Not Remarkable  Sensorium  Attention:  Attention: Normal  Concentration:  Concentration: Normal  Orientation:  Orientation: X5  Recall/memory:  Recall/Memory: Normal  Affect and Mood  Affect:  Affect: Appropriate  Mood:  Mood: Anxious  Relating  Eye contact:  Eye Contact: Normal  Facial expression:  Facial Expression: Depressed  Attitude toward examiner:  Attitude Toward Examiner: Cooperative  Thought and Language  Speech flow: Speech Flow: Clear and Coherent  Thought content:  Thought Content: Appropriate to Mood and Circumstances  Preoccupation:  Preoccupations: None  Hallucinations:  Hallucinations: None  Organization:     Transport planner of Knowledge:  Fund of Knowledge: Good  Intelligence:  Intelligence: Above Average  Abstraction:  Abstraction: Normal   Judgement:  Judgement: Good  Reality Testing:  Reality Testing: Realistic  Insight:  Insight: Good  Decision Making:  Decision Making: Normal  Social Functioning  Social Maturity:  Social Maturity: Isolates  Social Judgement:  Social Judgement: Normal  Stress  Stressors:  Stressors: Grief/losses (cousins around the country; lost brothers)  Coping Ability:  Coping Ability: Normal  Skill Deficits:  Skill Deficits: None  Supports:  Supports: Family, Church     Religion: Religion/Spirituality Are You A Religious Person?: Yes How Might This Affect Treatment?: small church--very active  Leisure/Recreation: Leisure / Recreation Do You Have Hobbies?: Yes Leisure and Hobbies: enjoys reading, playing cards, playing with [employer] and pug.  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 Has patient ever been in the [employer]?: No  Education: Education Is Patient Currently Attending School?: No Did Teacher, adult education From [employer]?: Yes Did Physicist, medical?: Yes Did Heritage manager?: No Did You Have An Individualized Education Program (IIEP): No Did You Have Any Difficulty At [employer]?: Yes Were Any Medications Ever Prescribed For These Difficulties?: No Patient's Education Has Been Impacted by Current Illness:  (pt unsure)   CCA Family/Childhood History  Family and Relationship History: Family history Marital status: Single What is your sexual orientation?: homosexual Does patient have children?: No  Childhood History:  Childhood History By whom was/is the patient raised?: Both parents Additional childhood  history information: paternal grandparents lived away.  Mother Merchant navy officer) father Social research officer, government).  Upper middle class family.  Brothers were older.  Mother and I got along until age 47.  We were conflicted. Description of patient's relationship with caregiver when they were a child: good relationship until teen years Patient's description of current relationship with people who raised him/her: deceased--parents and brothers Does patient have siblings?: Yes Number of Siblings: 2 Description of patient's current relationship with siblings: deceased Did patient suffer any verbal/emotional/physical/sexual abuse as a child?: No Did patient suffer from severe childhood neglect?: No Has patient ever been sexually abused/assaulted/raped as an adolescent or adult?: No Was the patient ever a victim of a crime or a disaster?: No Witnessed domestic violence?: No Has patient been affected by domestic violence as an adult?: No   CCA Substance Use  Alcohol/Drug Use: Alcohol / Drug Use History of alcohol / drug use?: No history of alcohol / drug abuse (drinks rarely; currently smokes MJ socially)     Patient Centered Plan: Patient is on the following Treatment Plan(s):  Depression    Ziquan Fidel R Porcha Deblanc, LCSW

## 2019-12-22 NOTE — Patient Instructions (Signed)
Caring for Your Mental Health Mental health is emotional, psychological, and social well-being. Mental health is just as important as physical health. In fact, mental and physical health are connected, and you need both to be healthy. Some signs of good mental health (well-being) include:  Being able to attend to tasks at home, school, or work.  Being able to manage stress and emotions.  Practicing self-care, which may include: ? A regular exercise pattern. ? A reasonably healthy diet. ? Supportive and trusting relationships. ? The ability to relax and calm yourself (self-calm).  Having pleasurable hobbies and activities to do.  Believing that you have meaning and purpose in your life.  Recovering and adjusting after facing challenges (resilience). You can take steps to build or strengthen these mentally healthy behaviors. There are resources and support to help you with this. Why is caring for mental health important? Caring for your mental health is a big part of staying healthy. Everyone has times when feelings, thoughts, or situations feel overwhelming. Mental health means having the skills to manage what feels overwhelming. If this sense of being overwhelmed persists, however, you might need some help. If you have some of the following signs, you may need to take better care of your mental health or seek help from a health care provider or mental health professional:  Problems with energy or focus.  Changes in eating habits.  Problems sleeping, such as sleeping too much or not enough.  Emotional distress, such as anger, sadness, depression, or anxiety.  Major changes in your relationships.  Losing interest in life or activities that you used to enjoy. If you have any of these symptoms on most days for 2 weeks or longer:  Talk with a close friend or family member about how you are feeling.  Contact your health care provider to discuss your symptoms.  Consider working with a  Education officer, community. Your health care provider, family, or friends may be able to recommend a therapist. What can I do to promote emotional and mental health? Managing emotions  Learn to identify emotions and deal with them. Recognizing your emotions is the first step in learning to deal with them.  Practice ways to appropriately express feelings. Remember that you can control your feelings. They do not control you.  Practice stress management techniques, such as: ? Relaxation techniques, like breathing or muscle relaxation exercises. ? Exercise. Regular activity can lower your stress level. ? Changing what you can change and accepting what you cannot change.  Build up your resilience so that you can recover and adjust after big problems or challenges. Practice resilient behaviors and attitudes: ? Set and focus on long-term goals. ? Develop and maintain healthy, supportive relationships. ? Learn to accept change and make the best of the situation. ? Take care of yourself physically by eating a healthy diet, getting plenty of sleep, and exercising regularly. ? Develop self-awareness. Ask others to give feedback about how they see you. ? Practice mindfulness meditation to help you stay calm when dealing with daily challenges. ? Learn to respond to situations in healthy ways, rather than reacting with your emotions. ? Keep a positive attitude, and believe in yourself. Your view of yourself affects your mental health. ? Develop your listening and empathy skills. These will help you deal with difficult situations and communications.  Remember that emotions can be used as a good source of communication and are a great source of energy. Try to laugh and find humor in life.  Sleeping  Get the right amount and quality of sleep. Sleep has a big impact on physical and mental health. To improve your sleep: ? Go to bed and wake up around the same time every day. ? Limit screen time before  bedtime. This includes the use of your cell phone, TV, computer, and tablet. ? Keep your bedroom dark and cool. Activity   Exercise or do some physical activity regularly. This helps: ? Keep your body strong, especially during times of stress. ? Get rid of chemicals in your body (hormones) that build up when you are stressed. ? Build up your resilience. Eating and drinking   Eat a healthy diet that includes whole grains, vegetables, fresh fruits, and lean proteins. If you have questions about what foods are best for you, ask your health care provider.  Try not to turn to sweet, salty, or otherwise unhealthy foods when you are tired or unhappy. This can lead to unwanted weight gain and is not a healthy way to cope with emotions. Where to find more information You can find more information about how to care for your mental health from:  [employer] on Mental Illness (NAMI): www.nami.[place]: https://carter.com/  Centers for Disease Control and Prevention: RapLives.dk Contact a health care provider if:  You lose interest in being with others or you do not want to leave the house.  You have a hard time completing your normal activities or you have less energy than normal.  You cannot stay focused or you have problems with memory.  You feel that your senses are heightened, and this makes you upset or concerned.  You feel nervous or have rapid mood changes.  You are sleeping or eating more or less than normal.  You question reality or you show odd behavior that disturbs you or others. Get help right away if:  You have thoughts about hurting yourself or others. If you ever feel like you may hurt yourself or others, or have thoughts about taking your own life, get help right away. You can go to your nearest emergency department or call:  Your local emergency services (911 in the [place]  A suicide crisis helpline, such as the  [facility] at 9134449069. This is open 24 hours a day. Summary  Mental health is not just the absence of mental illness. It involves understanding your emotions and behaviors, and taking steps to cope with them in a healthy way.  If you have symptoms of mental or emotional distress, get help from family, friends, a health care provider, or a mental health professional.  Practice good mental health behaviors such as stress management skills, self-calming skills, exercise, and healthy sleeping and eating. This information is not intended to replace advice given to you by your health care provider. Make sure you discuss any questions you have with your health care provider. Document Revised: 06/01/2017 Document Reviewed: 10/31/2016 Elsevier Patient Education  2020 [place] With Depression Everyone experiences occasional disappointment, sadness, and loss in their lives. When you are feeling down, blue, or sad for at least 2 weeks in a row, it may mean that you have depression. Depression can affect your thoughts and feelings, relationships, daily activities, and physical health. It is caused by changes in the way your brain functions. If you receive a diagnosis of depression, your health care provider will tell you which type of depression you have and what treatment options are available to you.  If you are living with depression, there are ways to help you recover from it and also ways to prevent it from coming back. How to cope with lifestyle changes Coping with stress     Stress is your body's reaction to life changes and events, both good and bad. Stressful situations may include:  Getting married.  The death of a spouse.  Losing a job.  Retiring.  Having a baby. Stress can last just a few hours or it can be ongoing. Stress can play a major role in depression, so it is important to learn both how to cope with stress and how to think about it  differently. Talk with your health care provider or a counselor if you would like to learn more about stress reduction. He or she may suggest some stress reduction techniques, such as:  Music therapy. This can include creating music or listening to music. Choose music that you enjoy and that inspires you.  Mindfulness-based meditation. This kind of meditation can be done while sitting or walking. It involves being aware of your normal breaths, rather than trying to control your breathing.  Centering prayer. This is a kind of meditation that involves focusing on a spiritual word or phrase. Choose a word, phrase, or sacred image that is meaningful to you and that brings you peace.  Deep breathing. To do this, expand your stomach and inhale slowly through your nose. Hold your breath for 3-5 seconds, then exhale slowly, allowing your stomach muscles to relax.  Muscle relaxation. This involves intentionally tensing muscles then relaxing them. Choose a stress reduction technique that fits your lifestyle and personality. Stress reduction techniques take time and practice to develop. Set aside 5-15 minutes a day to do them. Therapists can offer training in these techniques. The training may be covered by some insurance plans. Other things you can do to manage stress include:  Keeping a stress diary. This can help you learn what triggers your stress and ways to control your response.  Understanding what your limits are and saying no to requests or events that lead to a schedule that is too full.  Thinking about how you respond to certain situations. You may not be able to control everything, but you can control how you react.  Adding humor to your life by watching funny films or TV shows.  Making time for activities that help you relax and not feeling guilty about spending your time this way.  Medicines Your health care provider may suggest certain medicines if he or she feels that they will help  improve your condition. Avoid using alcohol and other substances that may prevent your medicines from working properly (may interact). It is also important to:  Talk with your pharmacist or health care provider about all the medicines that you take, their possible side effects, and what medicines are safe to take together.  Make it your goal to take part in all treatment decisions (shared decision-making). This includes giving input on the side effects of medicines. It is best if shared decision-making with your health care provider is part of your total treatment plan. If your health care provider prescribes a medicine, you may not notice the full benefits of it for 4-8 weeks. Most people who are treated for depression need to be on medicine for at least 6-12 months after they feel better. If you are taking medicines as part of your treatment, do not stop taking medicines without first talking to your health care provider.  You may need to have the medicine slowly decreased (tapered) over time to decrease the risk of harmful side effects. Relationships Your health care provider may suggest family therapy along with individual therapy and drug therapy. While there may not be family problems that are causing you to feel depressed, it is still important to make sure your family learns as much as they can about your mental health. Having your family's support can help make your treatment successful. How to recognize changes in your condition Everyone has a different response to treatment for depression. Recovery from major depression happens when you have not had signs of major depression for two months. This may mean that you will start to:  Have more interest in doing activities.  Feel less hopeless than you did 2 months ago.  Have more energy.  Overeat less often, or have better or improving appetite.  Have better concentration. Your health care provider will work with you to decide the next steps  in your recovery. It is also important to recognize when your condition is getting worse. Watch for these signs:  Having fatigue or low energy.  Eating too much or too little.  Sleeping too much or too little.  Feeling restless, agitated, or hopeless.  Having trouble concentrating or making decisions.  Having unexplained physical complaints.  Feeling irritable, angry, or aggressive. Get help as soon as you or your family members notice these symptoms coming back. How to get support and help from others How to talk with friends and family members about your condition  Talking to friends and family members about your condition can provide you with one way to get support and guidance. Reach out to trusted friends or family members, explain your symptoms to them, and let them know that you are working with a health care provider to treat your depression. Financial resources Not all insurance plans cover mental health care, so it is important to check with your insurance carrier. If paying for co-pays or counseling services is a problem, search for a local or county mental health care center. They may be able to offer public mental health care services at low or no cost when you are not able to see a private health care provider. If you are taking medicine for depression, you may be able to get the generic form, which may be less expensive. Some makers of prescription medicines also offer help to patients who cannot afford the medicines they need. Follow these instructions at home:   Get the right amount and quality of sleep.  Cut down on using caffeine, tobacco, alcohol, and other potentially harmful substances.  Try to exercise, such as walking or lifting small weights.  Take over-the-counter and prescription medicines only as told by your health care provider.  Eat a healthy diet that includes plenty of vegetables, fruits, whole grains, low-fat dairy products, and lean protein. Do not  eat a lot of foods that are high in solid fats, added sugars, or salt.  Keep all follow-up visits as told by your health care provider. This is important. Contact a health care provider if:  You stop taking your antidepressant medicines, and you have any of these symptoms: ? Nausea. ? Headache. ? Feeling lightheaded. ? Chills and body aches. ? Not being able to sleep (insomnia).  You or your friends and family think your depression is getting worse. Get help right away if:  You have thoughts of hurting yourself or others. If you ever feel like you  may hurt yourself or others, or have thoughts about taking your own life, get help right away. You can go to your nearest emergency department or call:  Your local emergency services (911 in the [place]  A suicide crisis helpline, such as the [facility] at 419-454-8357. This is open 24-hours a day. Summary  If you are living with depression, there are ways to help you recover from it and also ways to prevent it from coming back.  Work with your health care team to create a management plan that includes counseling, stress management techniques, and healthy lifestyle habits. This information is not intended to replace advice given to you by your health care provider. Make sure you discuss any questions you have with your health care provider. Document Revised: 10/11/2018 Document Reviewed: 05/22/2016 Elsevier Patient Education  [place].

## 2020-01-22 ENCOUNTER — Ambulatory Visit (INDEPENDENT_AMBULATORY_CARE_PROVIDER_SITE_OTHER): Payer: Medicare PPO | Admitting: Licensed Clinical Social Worker

## 2020-01-22 ENCOUNTER — Other Ambulatory Visit: Payer: Self-pay

## 2020-01-22 DIAGNOSIS — F3181 Bipolar II disorder: Secondary | ICD-10-CM

## 2020-01-22 NOTE — Progress Notes (Signed)
Virtual Visit via Video Note  I connected with Bobbe Medico on 01/22/20 at  1:30 PM EDT by a video enabled telemedicine application and verified that I am speaking with the correct person using two identifiers.  Location: Patient: home  Provider: [name]   I discussed the limitations of evaluation and management by telemedicine and the availability of in person appointments. The patient expressed understanding and agreed to proceed.   The patient was advised to call back or seek an in-person evaluation if the symptoms worsen or if the condition fails to improve as anticipated.  I provided 45 minutes of non-face-to-face time during this encounter.   Aleiah Mohammed R Sache Sane, LCSW    THERAPIST PROGRESS NOTE  Session Time: 1:30-2:15 pm  Participation Level: Active  Behavioral Response: Neat and Well GroomedAlertAnxious and Depressed  Type of Therapy: Individual Therapy  Treatment Goals addressed: Coping  Interventions: CBT and Supportive  Summary: ESHIKA RECKART is a 69 y.o. female who presents with improving symptoms related to current diagnosis.  Pt reports that mood is stable and managing stress/anxiety well. Pt recently traveled to [place] to visit family and friends--had an amazing trip. Explored pts thoughts and feelings triggered by the trip and discussed relationships with family members and friends that pt engaged with throughout the trip.    Discussed pts current levels of motivation and initiative. Discussed how chronic pain and energy levels play a role with current motivation/initiative. Discussed several behavior modifications pt could try to increase overall motivation and initiative. Encouraged pt to identify procrastination and try to stop it in the moment.   Suicidal/Homicidal: No  Therapist Response: [name] reports a decrease in overall symptoms since last session, which indicates overall progress towards mood management and anxiety/stress management goals.   Plan:  Return again in 4 weeks.  Diagnosis: Axis I: Bipolar, Depressed    Axis II: No diagnosis    Rachel Bo Aracelli Woloszyn, LCSW 01/22/2020

## 2020-01-22 NOTE — Patient Instructions (Signed)
Caring for Your Mental Health Mental health is emotional, psychological, and social well-being. Mental health is just as important as physical health. In fact, mental and physical health are connected, and you need both to be healthy. Some signs of good mental health (well-being) include:  Being able to attend to tasks at home, school, or work.  Being able to manage stress and emotions.  Practicing self-care, which may include: ? A regular exercise pattern. ? A reasonably healthy diet. ? Supportive and trusting relationships. ? The ability to relax and calm yourself (self-calm).  Having pleasurable hobbies and activities to do.  Believing that you have meaning and purpose in your life.  Recovering and adjusting after facing challenges (resilience). You can take steps to build or strengthen these mentally healthy behaviors. There are resources and support to help you with this. Why is caring for mental health important? Caring for your mental health is a big part of staying healthy. Everyone has times when feelings, thoughts, or situations feel overwhelming. Mental health means having the skills to manage what feels overwhelming. If this sense of being overwhelmed persists, however, you might need some help. If you have some of the following signs, you may need to take better care of your mental health or seek help from a health care provider or mental health professional:  Problems with energy or focus.  Changes in eating habits.  Problems sleeping, such as sleeping too much or not enough.  Emotional distress, such as anger, sadness, depression, or anxiety.  Major changes in your relationships.  Losing interest in life or activities that you used to enjoy. If you have any of these symptoms on most days for 2 weeks or longer:  Talk with a close friend or family member about how you are feeling.  Contact your health care provider to discuss your symptoms.  Consider working with a  Education officer, community. Your health care provider, family, or friends may be able to recommend a therapist. What can I do to promote emotional and mental health? Managing emotions  Learn to identify emotions and deal with them. Recognizing your emotions is the first step in learning to deal with them.  Practice ways to appropriately express feelings. Remember that you can control your feelings. They do not control you.  Practice stress management techniques, such as: ? Relaxation techniques, like breathing or muscle relaxation exercises. ? Exercise. Regular activity can lower your stress level. ? Changing what you can change and accepting what you cannot change.  Build up your resilience so that you can recover and adjust after big problems or challenges. Practice resilient behaviors and attitudes: ? Set and focus on long-term goals. ? Develop and maintain healthy, supportive relationships. ? Learn to accept change and make the best of the situation. ? Take care of yourself physically by eating a healthy diet, getting plenty of sleep, and exercising regularly. ? Develop self-awareness. Ask others to give feedback about how they see you. ? Practice mindfulness meditation to help you stay calm when dealing with daily challenges. ? Learn to respond to situations in healthy ways, rather than reacting with your emotions. ? Keep a positive attitude, and believe in yourself. Your view of yourself affects your mental health. ? Develop your listening and empathy skills. These will help you deal with difficult situations and communications.  Remember that emotions can be used as a good source of communication and are a great source of energy. Try to laugh and find humor in life.  Sleeping  Get the right amount and quality of sleep. Sleep has a big impact on physical and mental health. To improve your sleep: ? Go to bed and wake up around the same time every day. ? Limit screen time before  bedtime. This includes the use of your cell phone, TV, computer, and tablet. ? Keep your bedroom dark and cool. Activity   Exercise or do some physical activity regularly. This helps: ? Keep your body strong, especially during times of stress. ? Get rid of chemicals in your body (hormones) that build up when you are stressed. ? Build up your resilience. Eating and drinking   Eat a healthy diet that includes whole grains, vegetables, fresh fruits, and lean proteins. If you have questions about what foods are best for you, ask your health care provider.  Try not to turn to sweet, salty, or otherwise unhealthy foods when you are tired or unhappy. This can lead to unwanted weight gain and is not a healthy way to cope with emotions. Where to find more information You can find more information about how to care for your mental health from:  [employer] on Mental Illness (NAMI): www.nami.[place]: https://carter.com/  Centers for Disease Control and Prevention: RapLives.dk Contact a health care provider if:  You lose interest in being with others or you do not want to leave the house.  You have a hard time completing your normal activities or you have less energy than normal.  You cannot stay focused or you have problems with memory.  You feel that your senses are heightened, and this makes you upset or concerned.  You feel nervous or have rapid mood changes.  You are sleeping or eating more or less than normal.  You question reality or you show odd behavior that disturbs you or others. Get help right away if:  You have thoughts about hurting yourself or others. If you ever feel like you may hurt yourself or others, or have thoughts about taking your own life, get help right away. You can go to your nearest emergency department or call:  Your local emergency services (911 in the [place]  A suicide crisis helpline, such as the  [place] at 419-168-4210. This is open 24 hours a day. Summary  Mental health is not just the absence of mental illness. It involves understanding your emotions and behaviors, and taking steps to cope with them in a healthy way.  If you have symptoms of mental or emotional distress, get help from family, friends, a health care provider, or a mental health professional.  Practice good mental health behaviors such as stress management skills, self-calming skills, exercise, and healthy sleeping and eating. This information is not intended to replace advice given to you by your health care provider. Make sure you discuss any questions you have with your health care provider. Document Revised: 06/01/2017 Document Reviewed: 10/31/2016 Elsevier Patient Education  2020 [place] With Depression Everyone experiences occasional disappointment, sadness, and loss in their lives. When you are feeling down, blue, or sad for at least 2 weeks in a row, it may mean that you have depression. Depression can affect your thoughts and feelings, relationships, daily activities, and physical health. It is caused by changes in the way your brain functions. If you receive a diagnosis of depression, your health care provider will tell you which type of depression you have and what treatment options are available to you.  If you are living with depression, there are ways to help you recover from it and also ways to prevent it from coming back. How to cope with lifestyle changes Coping with stress     Stress is your body's reaction to life changes and events, both good and bad. Stressful situations may include:  Getting married.  The death of a spouse.  Losing a job.  Retiring.  Having a baby. Stress can last just a few hours or it can be ongoing. Stress can play a major role in depression, so it is important to learn both how to cope with stress and how to think about it  differently. Talk with your health care provider or a counselor if you would like to learn more about stress reduction. He or she may suggest some stress reduction techniques, such as:  Music therapy. This can include creating music or listening to music. Choose music that you enjoy and that inspires you.  Mindfulness-based meditation. This kind of meditation can be done while sitting or walking. It involves being aware of your normal breaths, rather than trying to control your breathing.  Centering prayer. This is a kind of meditation that involves focusing on a spiritual word or phrase. Choose a word, phrase, or sacred image that is meaningful to you and that brings you peace.  Deep breathing. To do this, expand your stomach and inhale slowly through your nose. Hold your breath for 3-5 seconds, then exhale slowly, allowing your stomach muscles to relax.  Muscle relaxation. This involves intentionally tensing muscles then relaxing them. Choose a stress reduction technique that fits your lifestyle and personality. Stress reduction techniques take time and practice to develop. Set aside 5-15 minutes a day to do them. Therapists can offer training in these techniques. The training may be covered by some insurance plans. Other things you can do to manage stress include:  Keeping a stress diary. This can help you learn what triggers your stress and ways to control your response.  Understanding what your limits are and saying no to requests or events that lead to a schedule that is too full.  Thinking about how you respond to certain situations. You may not be able to control everything, but you can control how you react.  Adding humor to your life by watching funny films or TV shows.  Making time for activities that help you relax and not feeling guilty about spending your time this way.  Medicines Your health care provider may suggest certain medicines if he or she feels that they will help  improve your condition. Avoid using alcohol and other substances that may prevent your medicines from working properly (may interact). It is also important to:  Talk with your pharmacist or health care provider about all the medicines that you take, their possible side effects, and what medicines are safe to take together.  Make it your goal to take part in all treatment decisions (shared decision-making). This includes giving input on the side effects of medicines. It is best if shared decision-making with your health care provider is part of your total treatment plan. If your health care provider prescribes a medicine, you may not notice the full benefits of it for 4-8 weeks. Most people who are treated for depression need to be on medicine for at least 6-12 months after they feel better. If you are taking medicines as part of your treatment, do not stop taking medicines without first talking to your health care provider.  You may need to have the medicine slowly decreased (tapered) over time to decrease the risk of harmful side effects. Relationships Your health care provider may suggest family therapy along with individual therapy and drug therapy. While there may not be family problems that are causing you to feel depressed, it is still important to make sure your family learns as much as they can about your mental health. Having your family's support can help make your treatment successful. How to recognize changes in your condition Everyone has a different response to treatment for depression. Recovery from major depression happens when you have not had signs of major depression for two months. This may mean that you will start to:  Have more interest in doing activities.  Feel less hopeless than you did 2 months ago.  Have more energy.  Overeat less often, or have better or improving appetite.  Have better concentration. Your health care provider will work with you to decide the next steps  in your recovery. It is also important to recognize when your condition is getting worse. Watch for these signs:  Having fatigue or low energy.  Eating too much or too little.  Sleeping too much or too little.  Feeling restless, agitated, or hopeless.  Having trouble concentrating or making decisions.  Having unexplained physical complaints.  Feeling irritable, angry, or aggressive. Get help as soon as you or your family members notice these symptoms coming back. How to get support and help from others How to talk with friends and family members about your condition  Talking to friends and family members about your condition can provide you with one way to get support and guidance. Reach out to trusted friends or family members, explain your symptoms to them, and let them know that you are working with a health care provider to treat your depression. Financial resources Not all insurance plans cover mental health care, so it is important to check with your insurance carrier. If paying for co-pays or counseling services is a problem, search for a local or county mental health care center. They may be able to offer public mental health care services at low or no cost when you are not able to see a private health care provider. If you are taking medicine for depression, you may be able to get the generic form, which may be less expensive. Some makers of prescription medicines also offer help to patients who cannot afford the medicines they need. Follow these instructions at home:   Get the right amount and quality of sleep.  Cut down on using caffeine, tobacco, alcohol, and other potentially harmful substances.  Try to exercise, such as walking or lifting small weights.  Take over-the-counter and prescription medicines only as told by your health care provider.  Eat a healthy diet that includes plenty of vegetables, fruits, whole grains, low-fat dairy products, and lean protein. Do not  eat a lot of foods that are high in solid fats, added sugars, or salt.  Keep all follow-up visits as told by your health care provider. This is important. Contact a health care provider if:  You stop taking your antidepressant medicines, and you have any of these symptoms: ? Nausea. ? Headache. ? Feeling lightheaded. ? Chills and body aches. ? Not being able to sleep (insomnia).  You or your friends and family think your depression is getting worse. Get help right away if:  You have thoughts of hurting yourself or others. If you ever feel like you  may hurt yourself or others, or have thoughts about taking your own life, get help right away. You can go to your nearest emergency department or call:  Your local emergency services (911 in the [place]  A suicide crisis helpline, such as the [place] at (343)747-2874. This is open 24-hours a day. Summary  If you are living with depression, there are ways to help you recover from it and also ways to prevent it from coming back.  Work with your health care team to create a management plan that includes counseling, stress management techniques, and healthy lifestyle habits. This information is not intended to replace advice given to you by your health care provider. Make sure you discuss any questions you have with your health care provider. Document Revised: 10/11/2018 Document Reviewed: 05/22/2016 Elsevier Patient Education  [place].

## 2020-01-30 IMAGING — DX DG SHOULDER 1V*L*
2 series · 2 of 2 positions shown · non-contrast
Comparison: 12/24/2017 CT

CLINICAL DATA: Postop left shoulder

EXAM:
LEFT SHOULDER - 1 VIEW

[shoulder obl]
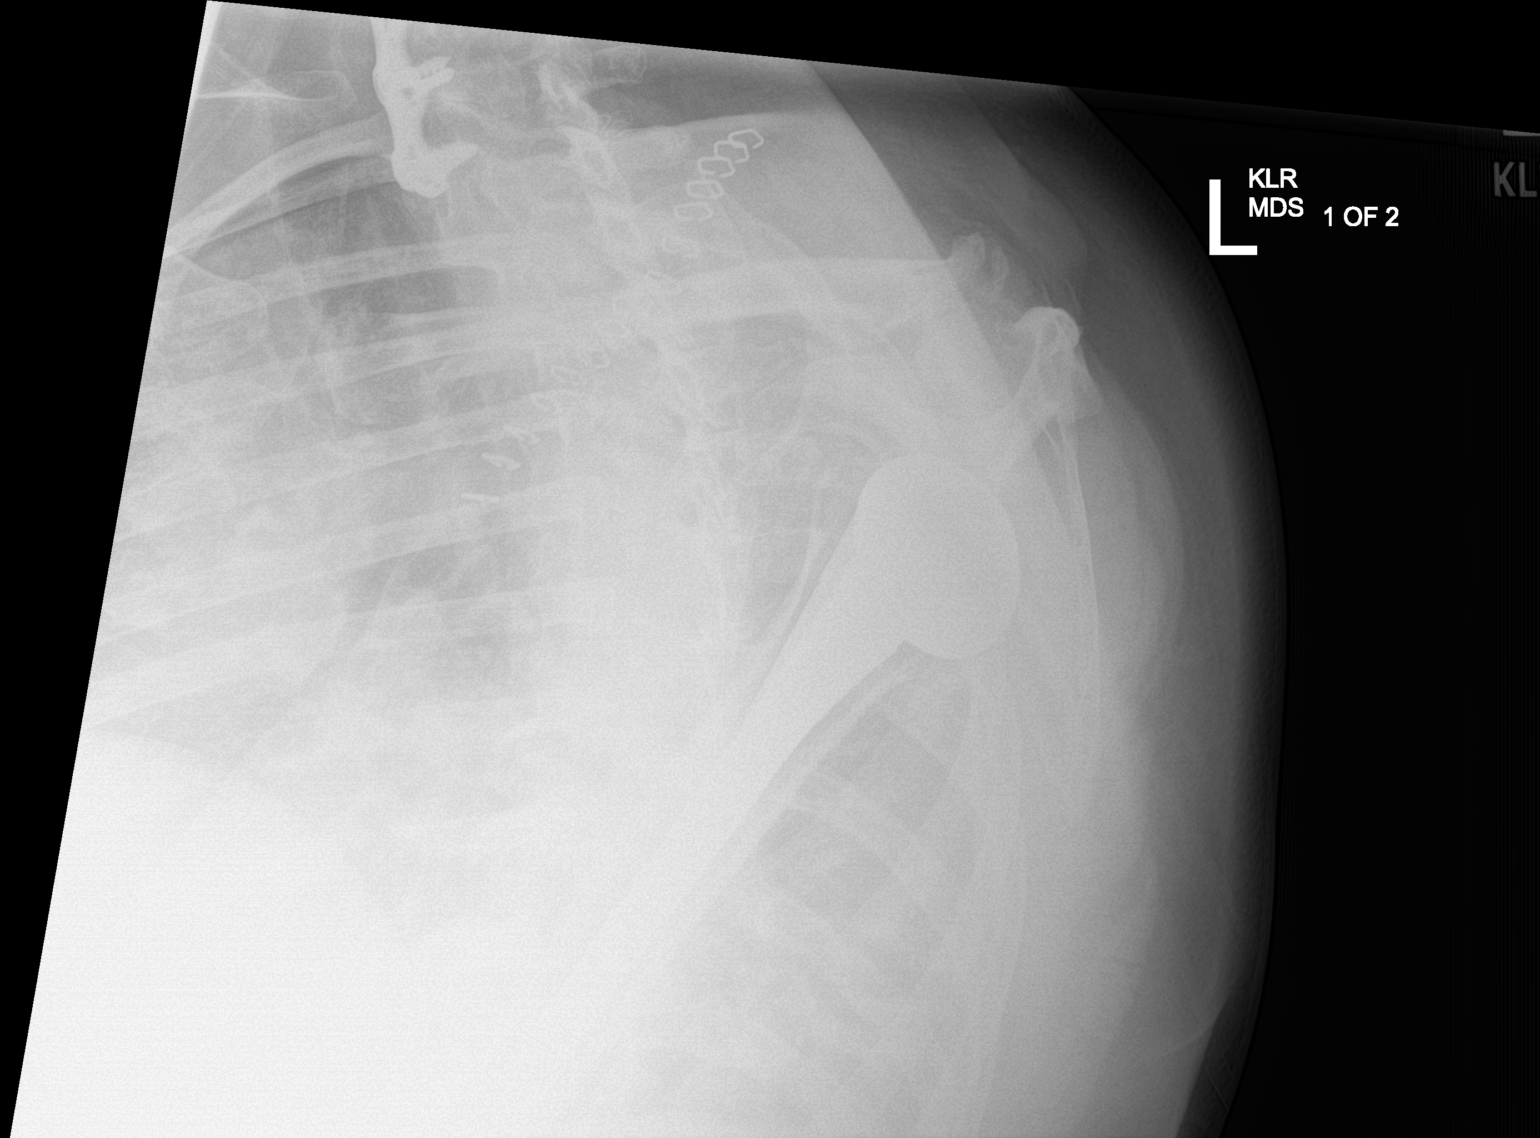

[shoulder ap]
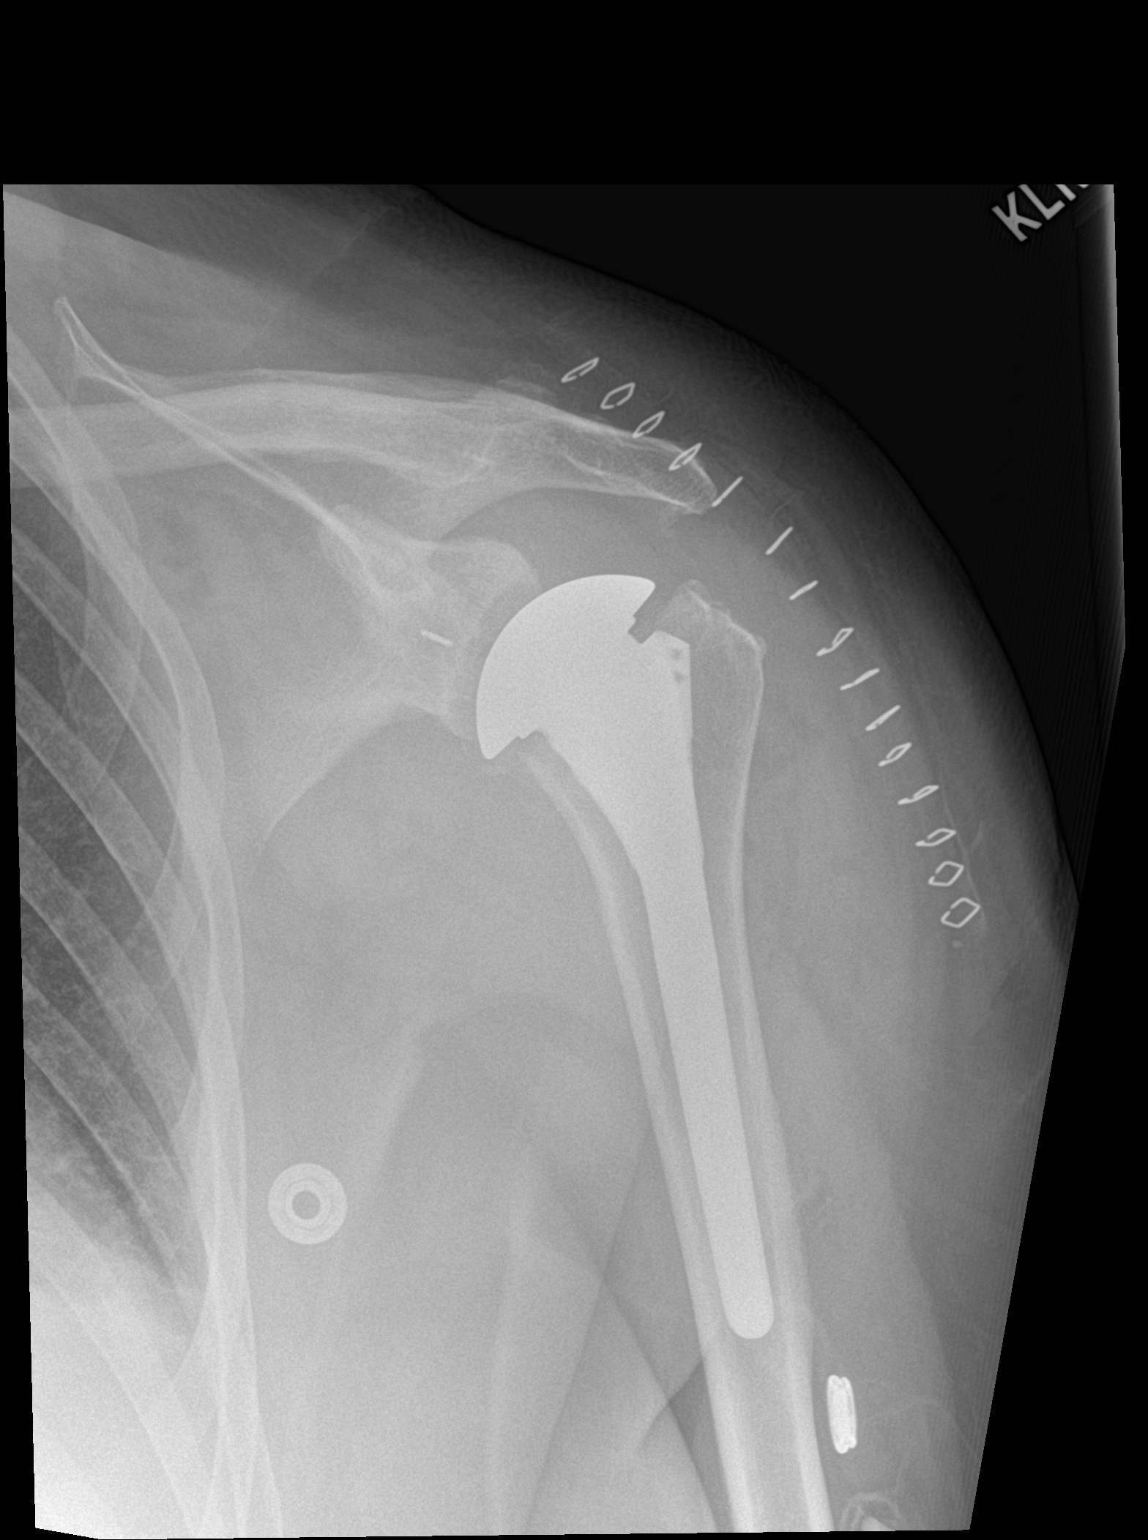

[2 of 2 positions shown; findings below may reference images not displayed]

FINDINGS: Two views of the left shoulder status post left shoulder
arthroplasty. The uncemented humeral component is unremarkable.
Postoperative complication or joint dislocation is identified.
Overlying skin staples are in place. Changes of prior ACDF noted of
the included lower cervical spine.
IMPRESSION: Left shoulder arthroplasty without complicating features noted.

## 2020-02-18 ENCOUNTER — Ambulatory Visit (INDEPENDENT_AMBULATORY_CARE_PROVIDER_SITE_OTHER): Payer: Medicare PPO | Admitting: Licensed Clinical Social Worker

## 2020-02-18 ENCOUNTER — Other Ambulatory Visit: Payer: Self-pay

## 2020-02-18 DIAGNOSIS — F3181 Bipolar II disorder: Secondary | ICD-10-CM | POA: Diagnosis not present

## 2020-02-18 NOTE — Progress Notes (Addendum)
Virtual Visit via Video Note  I connected with [name] on 02/18/20 at  1:30 PM EDT by a video enabled telemedicine application and verified that I am speaking with the correct person using two identifiers.  Location: Patient: home Provider: remote office [place], [place])   I discussed the limitations of evaluation and management by telemedicine and the availability of in person appointments. The patient expressed understanding and agreed to proceed.   I discussed the assessment and treatment plan with the patient. The patient was provided an opportunity to ask questions and all were answered. The patient agreed with the plan and demonstrated an understanding of the instructions.   The patient was advised to call back or seek an in-person evaluation if the symptoms worsen or if the condition fails to improve as anticipated.  I provided 30 minutes of non-face-to-face time during this encounter.   Lineth Thielke R Beulah Capobianco, LCSW    THERAPIST PROGRESS NOTE  Session Time: 12:30-1:00 pm  Participation Level: Active  Behavioral Response: Neat and Well GroomedAlertgenerally pleasant  Type of Therapy: Individual Therapy  Treatment Goals addressed: Coping  Interventions: CBT and Supportive  Summary: [name] is a 69 y.o. female who presents with improving symptoms related to her bipolar II diagnosis. Pt is compliant with medication, has a good appetite, and has been resting moderately well (waking up every 2 hours).   Discussed insomnia--pt is now reading every night and feels it is helping with insomnia symptoms. Encouraged pt to continue with before-bedtime reading as part of daily sleep routine.   Allowed pt to share thoughts and feelings associated with overall mood regulation and behaviors that pt is changing. Pt is trying to be more active throughout the day, but still allowing time for "batteries to recharge". Pt admits that physical limitations due to age trigger a negative  psychological impact. Discussed some alternative cognitive patterns.  Pt is also engaging in more social activities and trying to get out of the house more frequently. Pt is trying to cook more, and make healthier choices (drinking more water). Praised pts intentional healthy choices and overall healthy mindset.   Encouraged pt to continue with the behaviors that have contributed to overall stress management and mood regulation. Will re-assess in 3 wks.   Suicidal/Homicidal: No  Therapist Response: [name] was actively engaged throughout session. [name] reports that she has been very committed to making changes in her daily behaviors, and feel overall mood is better. Claire's decrease in symptoms is indicative of progress.   Plan: Return again in 3 weeks. Ongoing treatment plan to include maintaining current levels of progress and continue building emotion regulation, mood regulation, and stress management skills.  Diagnosis: Axis I: Bipolar, mixed    Axis II: No diagnosis    [place], LCSW 02/18/2020

## 2020-03-09 ENCOUNTER — Ambulatory Visit (INDEPENDENT_AMBULATORY_CARE_PROVIDER_SITE_OTHER): Payer: Medicare PPO | Admitting: Licensed Clinical Social Worker

## 2020-03-09 ENCOUNTER — Other Ambulatory Visit: Payer: Self-pay

## 2020-03-09 DIAGNOSIS — F3181 Bipolar II disorder: Secondary | ICD-10-CM

## 2020-03-09 NOTE — Progress Notes (Signed)
Virtual Visit via Video Note  I connected with [name] on 03/09/20 at  1:30 PM EDT by a video enabled telemedicine application and verified that I am speaking with the correct person using two identifiers.  Location: Patient: home Provider: [name]   I discussed the limitations of evaluation and management by telemedicine and the availability of in person appointments. The patient expressed understanding and agreed to proceed.  The patient was advised to call back or seek an in-person evaluation if the symptoms worsen or if the condition fails to improve as anticipated.  I provided 30 minutes of non-face-to-face time during this encounter.   Sameul Tagle R Amaiya Scruton, LCSW    THERAPIST PROGRESS NOTE  Session Time: 1:30-2:00 pm  Participation Level: Active  Behavioral Response: Neat and Well GroomedAlertgenerally pleasant  Type of Therapy: Individual Therapy  Treatment Goals addressed: Coping  Interventions: CBT and Supportive  Summary: [name] is a 69 y.o. female who presents with improving symptoms related to her diagnosis (bipolar). Pt reports stable mood and manageable stress/anxiety symptoms.  Allowed pt to explore and express thoughts and feelings about overall self management behaviors and pt reports that she has been socially engaging more, has been accomplishing some projects around the house that she has been procrastinating for a while, is getting regular massages, and is being intentional about "slowing down". 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  Encouraged pt to continue engaging in activities that are making her feel better about herself and bringing happiness. Praised pts progress.  Pt reporting insomnia continuing to be an issue--trouble falling asleep and staying asleep. Pt reports getting up and going to the bathroom a lot. Reviewed sleep hygiene.   Suicidal/Homicidal: No  SI, HI, or AVH reported at time of session.  Therapist Response:  [name] reports stable mood and decrease in overall anxiety, which is indicative of continuing progress.   Plan: Return again in 4 weeks. Ongoing treatment plan to include maintaining current levels of progress and continuing to evaluate and improve coping skills.   Diagnosis: Axis I: Bipolar, Depressed    Axis II: No diagnosis    Rachel Bo Loney Peto, LCSW 03/09/2020

## 2020-04-06 ENCOUNTER — Ambulatory Visit (INDEPENDENT_AMBULATORY_CARE_PROVIDER_SITE_OTHER): Payer: Medicare PPO | Admitting: Licensed Clinical Social Worker

## 2020-04-06 ENCOUNTER — Other Ambulatory Visit: Payer: Self-pay

## 2020-04-06 DIAGNOSIS — F3181 Bipolar II disorder: Secondary | ICD-10-CM | POA: Diagnosis not present

## 2020-04-06 NOTE — Progress Notes (Signed)
Virtual Visit via Video Note  I connected with [name] on 04/06/20 at  1:30 PM EDT by a video enabled telemedicine application and verified that I am speaking with the correct person using two identifiers.  Location: Patient: home Provider: [name]   I discussed the limitations of evaluation and management by telemedicine and the availability of in person appointments. The patient expressed understanding and agreed to proceed.   The patient was advised to call back or seek an in-person evaluation if the symptoms worsen or if the condition fails to improve as anticipated.  I provided 30 minutes of non-face-to-face time during this encounter.   Shawonda Kerce R Riyana Biel, LCSW    THERAPIST PROGRESS NOTE  Session Time: 1:30-2:00p  Participation Level: Active  Behavioral Response: Neat and Well GroomedAlertgenerally pleasant  Type of Therapy: Individual Therapy  Treatment Goals addressed: Coping  Interventions: CBT and Supportive  Summary: [name] is a 69 y.o. female who presents with improving symptoms related to bipolar diagnosis.  Pt reports improving quality and quantity of sleep, pt is compliant with medication, and pt is engaging well socially.  Allowed pt to explore and express concerns since last session and pt is happy with continuing progress. 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  Pt admits that she is being intentional each day to write down tasks on her task list, cross things off, and add to the the next day if needed.   Reviewed CBT skills to help maintain progress: mindfulness, meditation, cognitive stimulation, social engagement, physical activity (when pt feels she can manage health-wise).   Encouraged self care and being mindful of life balance.  Suicidal/Homicidal: No  SI, HI, or AVH reported at time of session.  Therapist Response: [name] reports that she has  maintained levels of progress from before and feels she is managing mood and stress/anxiety well. Treatment continues to show good evolution and development, which is indicative of progress.  Plan: Return again in 5 weeks. The ongoing treatment plan includes maintaining current levels of progress and continuing to build skills to manage mood, improve stress/anxiety management, emotion regulation, distress tolerance, and behavior modification.   Diagnosis: Axis I: Bipolar, Depressed    Axis II: No diagnosis    Rachel Bo Trey [place]br[place], LCSW 04/06/2020

## 2020-05-11 ENCOUNTER — Other Ambulatory Visit: Payer: Self-pay

## 2020-05-11 ENCOUNTER — Ambulatory Visit (INDEPENDENT_AMBULATORY_CARE_PROVIDER_SITE_OTHER): Payer: Medicare PPO | Admitting: Licensed Clinical Social Worker

## 2020-05-11 DIAGNOSIS — F3181 Bipolar II disorder: Secondary | ICD-10-CM | POA: Diagnosis not present

## 2020-05-11 NOTE — Progress Notes (Signed)
Virtual Visit via Video Note  I connected with [name] on 05/11/20 at  1:30 PM EST by a video enabled telemedicine application and verified that I am speaking with the correct person using two identifiers.  Video connection was lost when less than 50% of the duration of the visit was complete, at which time the remainder of the visit was completed via audio only.  Location: Patient: home  Provider: [name]   I discussed the limitations of evaluation and management by telemedicine and the availability of in person appointments. The patient expressed understanding and agreed to proceed.   The patient was advised to call back or seek an in-person evaluation if the symptoms worsen or if the condition fails to improve as anticipated.  I provided 30 minutes of non-face-to-face time during this encounter.   [name] R Imelda Dandridge, LCSW    THERAPIST PROGRESS NOTE  Session Time: 1:30-2:00p  Participation Level: Active  Behavioral Response: Well GroomedAlertgenerally pleasant; positive affect  Type of Therapy: Individual Therapy  Treatment Goals addressed: Coping  Interventions: Supportive  Summary: [name] is a 69 y.o. female who presents with improving symptoms related to bipolar disorder diagnosis. Pt reports stable mood and managing stress/anxiety well.   Pt reports that she is scheduling activities with friends to help her engage more in positive activities. 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 Pt feels that chronic pain is lower and pt is more physically active.  Praised pts progress and encouraged continued focus on self care and life balance.   Suicidal/Homicidal: No  SI, HI, or AVH reported at time of session.  Therapist Response: [name] continues to make good progress with self understanding, social engagement, and is making steady gaines in overall self esteem and confidence. These gains are reflective of overall progress. Treatment  showing good evolution and development.   Plan: Return again in 3 weeks.The ongoing treatment plan includes maintaining current levels of progress and continuing to build skills to manage mood, improve stress/anxiety management, emotion regulation, distress tolerance, and behavior modification.   Diagnosis: Axis I: Bipolar, Depressed    Axis II: No diagnosis    Rachel Bo Martin Belling, LCSW 05/11/2020

## 2020-06-10 ENCOUNTER — Other Ambulatory Visit: Payer: Self-pay

## 2020-06-10 ENCOUNTER — Ambulatory Visit (INDEPENDENT_AMBULATORY_CARE_PROVIDER_SITE_OTHER): Payer: Medicare PPO | Admitting: Licensed Clinical Social Worker

## 2020-06-10 DIAGNOSIS — F3181 Bipolar II disorder: Secondary | ICD-10-CM

## 2020-06-10 NOTE — Progress Notes (Signed)
Virtual Visit via Video Note  I connected with [name] on 06/10/20 at  1:30 PM EST by a video enabled telemedicine application and verified that I am speaking with the correct person using two identifiers.  Location: Patient: home Provider: [name]   I discussed the limitations of evaluation and management by telemedicine and the availability of in person appointments. The patient expressed understanding and agreed to proceed.   The patient was advised to call back or seek an in-person evaluation if the symptoms worsen or if the condition fails to improve as anticipated.  I provided 30 minutes of non-face-to-face time during this encounter.   Jmarion Christiano R Gabriell Daigneault, LCSW    THERAPIST PROGRESS NOTE  Session Time: 1:30-2p  Participation Level: Active  Behavioral Response: NeatAlertimproved, positive affect  Type of Therapy: Individual Therapy  Treatment Goals addressed: Coping  Interventions: Supportive  Summary: [name] is a 68 y.o. female who presents with improving symptoms related to bipolar disorder diagnosis. Pt reports that mood is more stable, and that she feels more in control of stress/anxiety.   Pt reports that energy levels are higher, so pt is more intentional about exercising/engaging in physical activity. Pt also has been intentional about going out and about with friends and attending events/activities that pt finds pleasure in.    Suicidal/Homicidal: No  SI, HI, or AVH reported at time of session.  Therapist Response: [name] continues to make good progress with self care, life management, mood regulation, and stress/anxiety regulation. Treatment showing good progress and development.   Plan: Return again in 4 weeks.  Diagnosis: Axis I: Bipolar, Depressed    Axis II: No diagnosis    [place], LCSW 06/10/2020

## 2020-07-15 ENCOUNTER — Ambulatory Visit (INDEPENDENT_AMBULATORY_CARE_PROVIDER_SITE_OTHER): Payer: Medicare PPO | Admitting: Licensed Clinical Social Worker

## 2020-07-15 ENCOUNTER — Other Ambulatory Visit: Payer: Self-pay

## 2020-07-15 DIAGNOSIS — F3181 Bipolar II disorder: Secondary | ICD-10-CM | POA: Diagnosis not present

## 2020-07-15 NOTE — Progress Notes (Signed)
Virtual Visit via Video Note  I connected with [name] on 07/15/20 at  2:00 PM EST by a video enabled telemedicine application and verified that I am speaking with the correct person using two identifiers.  Location: Patient: home Provider: [name]   I discussed the limitations of evaluation and management by telemedicine and the availability of in person appointments. The patient expressed understanding and agreed to proceed.  The patient was advised to call back or seek an in-person evaluation if the symptoms worsen or if the condition fails to improve as anticipated.  I provided 20 minutes of non-face-to-face time during this encounter.   Zamier Eggebrecht R Aviella Disbrow, LCSW    THERAPIST PROGRESS NOTE  Session Time: 2-2:20p  Participation Level: Active  Behavioral Response: Neat and Well GroomedAlertDepressed  Type of Therapy: Individual Therapy  Treatment Goals addressed: Coping  Interventions: CBT, Solution Focused and Supportive  Summary: [name] is a 70 y.o. female who presents with improving symptoms related to bipolar disorder diagnosis. Pt reports that mood is stable and that she is getting good quality and quantity of sleep.   Allowed pt to share thoughts and feelings about current coping skills that patient is continuing to develop and utilize on a daily basis. Pt reports that she is getting up early, planning tasks that she needs to do throughout the day, meal prep/meal plan, get dressed and ready for the day, and start working on the "to do" tasks for the day. Pt reports that some days she has more energy than others--pt is doing good at identifying energy levels and working with present levels of energy. Discussed life balance and also having days where pt can "let things go" without guilt.   Encouraged pt to continue focusing on self care, being intentional about keeping life in balance, and engaging in positive social engagements/cognitively stimulating activities.    Will email pt psychoeducational tools containing topics discussed in session.   Suicidal/Homicidal: No  Therapist Response: [name] continues to make good progress with self understanding, increased development of capacity for self care and life management, and making steady gains in self confidence and self esteem--which is reflective of overall progress.   Plan: Return again in 4 weeks.  Diagnosis: Axis I: Bipolar, Depressed    Axis II: No diagnosis    Rachel Bo Yomar Mejorado, LCSW 07/15/2020

## 2020-08-12 ENCOUNTER — Ambulatory Visit (INDEPENDENT_AMBULATORY_CARE_PROVIDER_SITE_OTHER): Payer: Medicare PPO | Admitting: Licensed Clinical Social Worker

## 2020-08-12 ENCOUNTER — Other Ambulatory Visit: Payer: Self-pay

## 2020-08-12 DIAGNOSIS — F3181 Bipolar II disorder: Secondary | ICD-10-CM | POA: Diagnosis not present

## 2020-08-12 NOTE — Progress Notes (Signed)
Virtual Visit via Video Note  I connected with [name] on 08/12/20 at  1:00 PM EST by a video enabled telemedicine application and verified that I am speaking with the correct person using two identifiers.  Location: Patient: home Provider: [name]   I discussed the limitations of evaluation and management by telemedicine and the availability of in person appointments. The patient expressed understanding and agreed to proceed.  I discussed the assessment and treatment plan with the patient. The patient was provided an opportunity to ask questions and all were answered. The patient agreed with the plan and demonstrated an understanding of the instructions.   The patient was advised to call back or seek an in-person evaluation if the symptoms worsen or if the condition fails to improve as anticipated.  I provided 30 minutes of non-face-to-face time during this encounter.   Vonnie Spagnolo R Julieann Drummonds, LCSW    THERAPIST PROGRESS NOTE  Session Time: 1-1:45p  Participation Level: Active  Behavioral Response: Neat and Well GroomedAlertAnxious  Type of Therapy: Individual Therapy  Treatment Goals addressed: Identify and utilize anxiety coping mechanism that has been successful and increase its use  Interventions: Supportive  Summary: [name] is a 70 y.o. female who presents with improving symptoms related to bipolar disorder diagnosis. Pt reports that she is managing stress well and mood is overall stable. Pt reports that she is continuing to lose weight through [employer] and feels that it is making a big difference in the way that she is feeling about herself.  Pt reports that she is continuing to struggle with pain in rt shoulder, lt elbow, and back. Pt feels her pain is interfering with overall ability to do most tasks around the house. Pt does report that she has a great support system and feels that she has good help.   Reviewed mindfulness activities and ways that patient can boost  endorphins to help manage mood and stress. Encouraged mindfulness as part of healthy sleep hygiene.   Suicidal/Homicidal: No  Therapist Response: [name] reports elevated mood and is increasing overall social interaction with family and friends. Ilean is able to make positive statements regarding self and ability to cope with stresses of life. Pt feels she is challenging her depressive thinking patterns and has ability to use skills to reframe. This is evidence to support ongoing progress towards goal achievement. Treatment to continue as indicated.  Plan: Return again in 4 weeks.  Diagnosis: Axis I: Bipolar, Depressed    Axis II: No diagnosis    Sara Bo Sim Choquette, LCSW 08/12/2020

## 2020-09-08 ENCOUNTER — Telehealth: Payer: Self-pay | Admitting: Licensed Clinical Social Worker

## 2020-09-08 NOTE — Telephone Encounter (Signed)
Spoke w/ pt--pt is feeling stressed and requesting an appt before Wednesday. No available appts open--told pt that she would be placed on call list and we will call if there is a cancellation/no show.  Pt reflected understanding and agrees. Pt states that she is not in crisis and can wait until her appt on Wednesday, but would like a time sooner.  [name], MSW, LCSW Outpatient Therapist/Triage Specialist

## 2020-09-10 ENCOUNTER — Ambulatory Visit (INDEPENDENT_AMBULATORY_CARE_PROVIDER_SITE_OTHER): Payer: Medicare PPO | Admitting: Licensed Clinical Social Worker

## 2020-09-10 ENCOUNTER — Other Ambulatory Visit: Payer: Self-pay

## 2020-09-10 DIAGNOSIS — F3181 Bipolar II disorder: Secondary | ICD-10-CM

## 2020-09-10 NOTE — Progress Notes (Signed)
Virtual Visit via Video Note  I connected with [name] on 09/10/20 at 11:00 AM EST by a video enabled telemedicine application and verified that I am speaking with the correct person using two identifiers.  Location: Patient: home Provider: remote office [place], [place])   I discussed the limitations of evaluation and management by telemedicine and the availability of in person appointments. The patient expressed understanding and agreed to proceed.  I discussed the assessment and treatment plan with the patient. The patient was provided an opportunity to ask questions and all were answered. The patient agreed with the plan and demonstrated an understanding of the instructions.   The patient was advised to call back or seek an in-person evaluation if the symptoms worsen or if the condition fails to improve as anticipated.  I provided 60 minutes of non-face-to-face time during this encounter.   [place], LCSW    THERAPIST PROGRESS NOTE  Session Time: 11a-12p  Participation Level: Active  Behavioral Response: Neat and Well GroomedAlertAnxious  Type of Therapy: Individual Therapy  Treatment Goals addressed: Anxiety and Coping  Interventions: Solution Focused and Supportive  Summary: [name] is a 70 y.o. female who presents with symptoms associated with depression/anxiety. Pt reports that mood has shifted lower recently, and pt requested this appt sooner than scheduled date.   Pt reports that she has had poor quality and quantity of sleep recently.   Allowed pt to explore and express recent stressors, and pt is concerned about recent grief, dog that she is unsure needs euthanasia, and dog that recently ate rat poison and needed emergency vet services with significant financial implications. Allowed pt to express overall psychological impact.   Continued recommendations are as follows: self care behaviors, positive social engagements, focusing on overall  work/home/life balance, and focusing on positive physical and emotional wellness.   Suicidal/Homicidal: No  Therapist Response: [name] is able to recognize when mood is shifting and takes the next step to intervene right away. [name] can identify coping mechanisms that have been successful in the past and continue utilizing those. [name] is weighing out pros and cons to solve stress-causing problems. These behaviors are reflective of personal growth and progress. Treatment to continue.  Plan: Return again in 1 weeks.  Diagnosis: Axis I: Bipolar, Depressed    Axis II: No diagnosis    Rachel Bo Svea Pusch, LCSW 09/10/2020

## 2020-09-15 ENCOUNTER — Ambulatory Visit (INDEPENDENT_AMBULATORY_CARE_PROVIDER_SITE_OTHER): Payer: Medicare PPO | Admitting: Licensed Clinical Social Worker

## 2020-09-15 ENCOUNTER — Other Ambulatory Visit: Payer: Self-pay

## 2020-09-15 DIAGNOSIS — F3181 Bipolar II disorder: Secondary | ICD-10-CM | POA: Diagnosis not present

## 2020-09-15 NOTE — Progress Notes (Signed)
Virtual Visit via Video Note  I connected with [name] on 09/15/20 at 10:00 AM EDT by a video enabled telemedicine application and verified that I am speaking with the correct person using two identifiers.  Location: Patient: home Provider: [name]   I discussed the limitations of evaluation and management by telemedicine and the availability of in person appointments. The patient expressed understanding and agreed to proceed.  I discussed the assessment and treatment plan with the patient. The patient was provided an opportunity to ask questions and all were answered. The patient agreed with the plan and demonstrated an understanding of the instructions.   The patient was advised to call back or seek an in-person evaluation if the symptoms worsen or if the condition fails to improve as anticipated.  I provided 45 minutes of non-face-to-face time during this encounter.   Roxanne Panek R Ebon Ketchum, LCSW    THERAPIST PROGRESS NOTE  Session Time: 10-10:45a  Participation Level: Active  Behavioral Response: Neat and Well GroomedAlertDepressed  Type of Therapy: Individual Therapy  Treatment Goals addressed: Coping  Interventions: CBT, Solution Focused and Supportive  Summary: [name] is a 70 y.o. female who presents with continuing symptoms related to bipolar disorder--depressed. Pt feels that she is experiencing more depression symptoms including lack of motivation, initiative, and inability to experience pleasure in situations that used to bring joy. Pt reports that she is sleeping well and compliant with medication.  Allowed pt to explore recent stressors and discussed overall psychological impact. 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  Reviewed cognitive stimulation exercises and recommended pt be as physically active as possible. Discussed managing stress/anxiety.  Will recheck pt in 2 weeks and  if symptoms no better, recommend to follow up with psychiatrist for medication management of symptoms.  Suicidal/Homicidal: No SI, HI, or AVH  Therapist Response: [name] is experiencing an escalation of symptoms, which is indicative of fluctuating/intermittent progress. Treatment to continue as indicated. Pt may reach out to psychiatrist at next appt if symptoms not improved.   Plan: Return again in 4 weeks.  Diagnosis: Axis I: Bipolar, Depressed    Axis II: No diagnosis    [place], LCSW 09/15/2020

## 2020-09-29 ENCOUNTER — Ambulatory Visit (INDEPENDENT_AMBULATORY_CARE_PROVIDER_SITE_OTHER): Payer: Medicare PPO | Admitting: Licensed Clinical Social Worker

## 2020-09-29 ENCOUNTER — Other Ambulatory Visit: Payer: Self-pay

## 2020-09-29 DIAGNOSIS — F3181 Bipolar II disorder: Secondary | ICD-10-CM

## 2020-09-29 NOTE — Progress Notes (Signed)
Office Visit Note  I connected with [name] on 09/29/2020 at  1:00pm EDT .  Location: Patient: [name]  Provider: [name]   I discussed the assessment and treatment plan with the patient. The patient was provided an opportunity to ask questions and all were answered. The patient agreed with the plan and demonstrated an understanding of the instructions.   I provided 60 minutes of non-face-to-face time during this encounter.   [name] R Maye Parkinson, LCSW   THERAPIST PROGRESS NOTE  Session Time: 1-2p  Participation Level: Active  Behavioral Response: CasualAlertDepressed  Type of Therapy: Individual Therapy  Treatment Goals addressed: Coping  Interventions: CBT and Supportive  Summary: [name] is a 70 y.o. female who presents with symptoms consistent with bipolar disorder (depression). Pt reports that overall mood has been low, but she has the energy to do the things that she knows makes her feel better. Pt reports that she is trying to go out and engage socially, spends time with her dogs, gets up and gets ready through the day, and is focusing on healthy nutrition/drinking water. Praised pts commitment to self care and encouraged her to continue. Pt is continuing to be in chronic pain but is working with her energy levels--when energy is higher, pt is working on tasks that she needs to accomplish. When energy levels are lower, pt is focusing on self care and recovery.  Continued recommendations are as follows: self care behaviors, positive social engagements, focusing on overall work/home/life balance, and focusing on positive physical and emotional wellness.   Suicidal/Homicidal: No  Therapist Response: [name] is able to recognize when mood is shifting and takes the next step to intervene right away. [name] can identify coping mechanisms that have been successful in the past and continue utilizing those. [name] is weighing out pros and cons to solve stress-causing problems. These  behaviors are reflective of personal growth and progress. Treatment to continue  Plan: Return again in 3 weeks.  Diagnosis: Axis I: Bipolar, Depressed    Axis II: No diagnosis    [name], LCSW 09/29/2020

## 2020-09-30 ENCOUNTER — Ambulatory Visit: Payer: Medicare PPO | Admitting: Licensed Clinical Social Worker

## 2020-10-20 ENCOUNTER — Other Ambulatory Visit: Payer: Self-pay

## 2020-10-20 ENCOUNTER — Ambulatory Visit (INDEPENDENT_AMBULATORY_CARE_PROVIDER_SITE_OTHER): Payer: Medicare PPO | Admitting: Licensed Clinical Social Worker

## 2020-10-20 DIAGNOSIS — F3181 Bipolar II disorder: Secondary | ICD-10-CM

## 2020-10-20 NOTE — Progress Notes (Signed)
OPT Note  I connected with Bobbe Medico on 10/20/20 at  2:00 PM EDT and verified that I am speaking with the correct person using two identifiers.  Location: Patient: [name] Provider: [name]   I discussed the assessment and treatment plan with the patient. The patient was provided an opportunity to ask questions and all were answered. The patient agreed with the plan and demonstrated an understanding of the instructions.   I provided 60 minutes of non-face-to-face time during this encounter.   Serenidy Waltz R Calum Cormier, LCSW    THERAPIST PROGRESS NOTE  Session Time: 2-3p  Participation Level: Active  Behavioral Response: Neat and Well GroomedAlertDepressed  Type of Therapy: Individual Therapy  Treatment Goals addressed: Coping  Interventions: CBT  Summary: [name] is a 70 y.o. female who presents with stabilizing symptoms related to bipolar disorder. Pt reporting that overall mood has been stable and that she is managing stress and anxiety well. Pt reporting improved quality and quantity of sleep.   [name] reports that she is socially engaging with friends more and is being intentional about getting chores/tasks done around the house. Pt is recognizing her successes on a daily basis, which pt feels motivates her. Pt is recognizing that she is the one that is putting forth the effort and being intentional about recreational, social, and chores at home that fill her with a sense of accomplishment.  Continued recommendations are as follows: self care behaviors, positive social engagements, focusing on overall work/home/life balance, and focusing on positive physical and emotional wellness.    Suicidal/Homicidal: No  Therapist Response: [name] is able to recognize when mood is shifting and takes the next step to intervene right away. [name] can identify coping mechanisms that have been successful in the past and continue utilizing those. [name] is weighing out pros and cons to solve  stress-causing problems. These behaviors are reflective of personal growth and progress. Treatment to continue.  Plan: Return again in 3 weeks.  Diagnosis: Axis I: Bipolar, Depressed    Axis II: No diagnosis    Sara Bo Girard Koontz, LCSW 10/20/2020

## 2020-11-10 ENCOUNTER — Ambulatory Visit: Payer: Medicare PPO | Admitting: Licensed Clinical Social Worker

## 2020-11-10 ENCOUNTER — Other Ambulatory Visit: Payer: Self-pay

## 2020-12-01 ENCOUNTER — Ambulatory Visit (INDEPENDENT_AMBULATORY_CARE_PROVIDER_SITE_OTHER): Payer: Medicare PPO | Admitting: Licensed Clinical Social Worker

## 2020-12-01 ENCOUNTER — Other Ambulatory Visit: Payer: Self-pay

## 2020-12-01 DIAGNOSIS — F3181 Bipolar II disorder: Secondary | ICD-10-CM | POA: Diagnosis not present

## 2020-12-01 NOTE — Progress Notes (Signed)
In Office Outpatient Therapy Note  I connected with Bobbe Medico on 12/01/20 at  2:00 PM EDT and verified that I am speaking with the correct person using two identifiers.  Location: Patient: [name]  Provider: [name]   I discussed the assessment and treatment plan with the patient. The patient was provided an opportunity to ask questions and all were answered. The patient agreed with the plan and demonstrated an understanding of the instructions.    I provided 60 minutes of face-to-face time during this encounter.   [name] Sara Romesha Scherer, LCSW    THERAPIST PROGRESS NOTE  Session Time: 2-3p  Participation Level: Active  Behavioral Response: CasualAlertDepressed  Type of Therapy: Individual Therapy  Treatment Goals addressed: Anxiety and Coping  Interventions: CBT and Supportive  Summary: [name] is a 70 y.o. female who presents with improving symptoms related to bipolar disorder diagnosis. Pt reports that she is compliant with medication and that she is using her coping skills to manage stress and anxiety on a regular basis. Pt reporting good quality and quantity of sleep. Occasional/rare insomnia.  Allowed pt to explore and express thoughts and feelings associated with recent life situations and external stressors. Pt had concerns about her E[employer] dog [name]) suffering with some eye-related issues. Pt had to take the dog to several veterinary specialists and was worried about the vet bills. Pt states that friends/family have donated money to help with the vet bills and pt is very thankful.   Pt is excited about upcoming trip to [place] in July with extended family.   Discussed situational mood fluctuations--pt feels she is having "mania spikes" where she is having more energy. Pt says that she is not being self destructive during these periods--she is actually spending time working on projects and finishing things on her to do list. Encouraged pt to focus on keeping things in  balance and allowing ample mindfulness/relaxation time along with activities.  Continued recommendations are as follows: self care behaviors, positive social engagements, focusing on overall work/home/life balance, and focusing on positive physical and emotional wellness.   Suicidal/Homicidal: No  Therapist Response: [name] is able to recognize when mood is shifting and takes the next step to intervene right away. [name] can identify coping mechanisms that have been successful in the past and continue utilizing those. [name] is weighing out pros and cons to solve stress-causing problems. These behaviors are reflective of personal growth and progress. Treatment to continue  Plan: Return again in 4 weeks.  Diagnosis: Axis I: Bipolar, Depressed    Axis II: No diagnosis    Sara Bo Asaph Serena, LCSW 12/01/2020

## 2020-12-07 ENCOUNTER — Ambulatory Visit: Payer: Medicare PPO | Admitting: Licensed Clinical Social Worker

## 2021-01-05 ENCOUNTER — Ambulatory Visit: Payer: Medicare PPO | Admitting: Licensed Clinical Social Worker

## 2021-10-12 ENCOUNTER — Other Ambulatory Visit: Payer: Self-pay | Admitting: Family Medicine

## 2021-10-12 DIAGNOSIS — M5416 Radiculopathy, lumbar region: Secondary | ICD-10-CM

## 2021-10-19 ENCOUNTER — Ambulatory Visit
Admission: RE | Admit: 2021-10-19 | Discharge: 2021-10-19 | Disposition: A | Discharge status: Home / Self Care | Payer: Medicare PPO | Source: Ambulatory Visit | Attending: Family Medicine | Admitting: Family Medicine

## 2021-10-19 DIAGNOSIS — M5416 Radiculopathy, lumbar region: Secondary | ICD-10-CM | POA: Diagnosis present

## 2022-02-21 ENCOUNTER — Ambulatory Visit (HOSPITAL_COMMUNITY): Payer: Medicare PPO | Admitting: Licensed Clinical Social Worker

## 2022-03-23 ENCOUNTER — Ambulatory Visit (INDEPENDENT_AMBULATORY_CARE_PROVIDER_SITE_OTHER): Payer: Medicare PPO | Admitting: Licensed Clinical Social Worker

## 2022-03-23 DIAGNOSIS — F3181 Bipolar II disorder: Secondary | ICD-10-CM

## 2022-03-23 NOTE — Progress Notes (Signed)
Virtual Visit via Video Note  I connected with [name] on 03/23/22 at  3:00 PM EDT by a video enabled telemedicine application and verified that I am speaking with the correct person using two identifiers.  Location: Patient: home Provider: [place] Office   I discussed the limitations of evaluation and management by telemedicine and the availability of in person appointments. The patient expressed understanding and agreed to proceed.  I discussed the assessment and treatment plan with the patient. The patient was provided an opportunity to ask questions and all were answered. The patient agreed with the plan and demonstrated an understanding of the instructions.   The patient was advised to call back or seek an in-person evaluation if the symptoms worsen or if the condition fails to improve as anticipated.  I provided 55 minutes of non-face-to-face time during this encounter.   Osker Ayoub R Tyrik Stetzer, LCSW   THERAPIST PROGRESS NOTE  Session Time: 3-355p  Participation Level: Active  Behavioral Response: CasualAlertAnxious and Depressed  Type of Therapy: Individual Therapy  Treatment Goals addressed: Problem: Depression  Goal:  Decrease depressive symptoms and improve levels of effective functioning-pt reports a decrease in overall depression symptoms 3 out of 5 sessions documented.  Outcome: Initial Goal: Alleviate depressive/manic symptoms and return to improved levels of effective functioning per pt self report 3 out of 5 sessions documented.   Outcome: Initial   Developed/revised tx plan based on pt self reported input. Pt verbally agrees with treatment plan at time of session     ProgressTowards Goals: Not Progressing  Interventions: CBT, Motivational Interviewing, Solution Focused, and Other: trauma focused  Summary: [name] is a 71 y.o. female who presents with ongoing symptoms related to bipolar disorder symptoms. Pt reports  that she has been noncompliant with medications for over a year.   Patient reports that she has been off her psychiatric medicines for over one year now. 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  Allow patient to explore and express thoughts and feelings associated That's an external stressors. Patient reports that she recently went on a trip to [place] to meet a friend, and had a very negative overall experience. Patient reports that the trip was not planned out well, there were transportation issues, and she was not satisfied with the living conditions at her hosts home. Patient reports that it took her a lot of time to get to [place], and then she made the decision to turn around and go back home just a few days later. Patient reports that she ran into similar issues with flights, trains, etc. Patient found the whole experience to be an extremely negative. Allow patient to identify thoughts and feelings associated with this experience. Patient reports that she definitely understands that traveling is difficult at her age solo.  Allowed patient to explore hobbies, and positive social engagement. Patient reports that she is engaging socially with her friends on a regular basis, and feels that they are very good support for her. Patient reports that she knows she needs to engage in more self-care, but patient realizes now that this is something that she can do. Patient reports that when she came back from her trip, she was depressed for over one month.   Developed new treatment plan and explored personal goals.   Continued recommendations are as follows: self care behaviors, positive social engagements, focusing on overall work/home/life balance, and focusing on positive physical and emotional wellness.   Suicidal/Homicidal: No  Therapist Response: Pt is continuing to apply interventions learned in session into daily life situations. Pt is currently on track to meet goals utilizing  interventions mentioned above. Personal growth and progress noted. Treatment to continue as indicated.   Plan: Return again in 4 weeks.  Diagnosis:  Encounter Diagnosis  Name Primary?   Bipolar II disorder, most recent episode major depressive ([place]) Yes    Collaboration of Care: Other Pt to follow up with Hal Morales for medication management of symptoms. Pt to continue with Clancy Gourd at pain clinic for chronic pain management.  Patient/Guardian was advised Release of Information must be obtained prior to any record release in order to collaborate their care with an outside provider. Patient/Guardian was advised if they have not already done so to contact the registration department to sign all necessary forms in order for [place] to release information regarding their care.   Consent: Patient/Guardian gives verbal consent for treatment and assignment of benefits for services provided during this visit. Patient/Guardian expressed understanding and agreed to proceed.   [place], LCSW 03/27/2022

## 2022-03-27 ENCOUNTER — Encounter (HOSPITAL_COMMUNITY): Payer: Self-pay

## 2022-03-27 NOTE — Plan of Care (Signed)
  Problem: Depression  Goal:  Decrease depressive symptoms and improve levels of effective functioning-pt reports a decrease in overall depression symptoms 3 out of 5 sessions documented.  Outcome: Initial Goal: Alleviate depressive/manic symptoms and return to improved levels of effective functioning per pt self report 3 out of 5 sessions documented.   Outcome: Initial   Developed/revised tx plan based on pt self reported input. Pt verbally agrees with treatment plan at time of session

## 2022-05-02 ENCOUNTER — Ambulatory Visit (INDEPENDENT_AMBULATORY_CARE_PROVIDER_SITE_OTHER): Payer: Medicare PPO | Admitting: Licensed Clinical Social Worker

## 2022-05-02 ENCOUNTER — Telehealth (HOSPITAL_COMMUNITY): Payer: Medicare PPO | Admitting: Licensed Clinical Social Worker

## 2022-05-02 DIAGNOSIS — F3181 Bipolar II disorder: Secondary | ICD-10-CM | POA: Diagnosis not present

## 2022-05-02 NOTE — Plan of Care (Signed)
  Problem: Depression  Goal:  Decrease depressive symptoms and improve levels of effective functioning-pt reports a decrease in overall depression symptoms 3 out of 5 sessions documented.  Outcome: Progressing Goal: Alleviate depressive/manic symptoms and return to improved levels of effective functioning per pt self report 3 out of 5 sessions documented.   Outcome: Progressing Intervention: REVIEW PLEASE SKILLS (TREAT PHYSICAL ILLNESS, BALANCE EATING, AVOID MOOD-ALTERING SUBSTANCES, BALANCE SLEEP AND GET EXERCISE) WITH [name] "[name]" Note: Reviewed  Intervention: Encourage verbalization of feelings/concerns/expectations Note: Pt explored and expressed    Problem: Anxiety  Goal: Reduce overall frequency, intensity, and duration of the anxiety so that daily functioning is not impaired per pt self report 3 out of 5 sessions documented.   Outcome: Not Progressing Goal: Learn and implement coping skills that result in a reduction of anxiety and worry, and improve daily functioning per pt report 3 out of 5 sessions documented  Outcome: Progressing Intervention: Assist with relaxation techniques, as appropriate (deep breathing exercises, meditation, guided imagery) Note: Reviewed and allowed pt to identify successful coping skills

## 2022-05-02 NOTE — Plan of Care (Signed)
  Problem: Depression  Goal:  Decrease depressive symptoms and improve levels of effective functioning-pt reports a decrease in overall depression symptoms 3 out of 5 sessions documented.  Outcome: Progressing Goal: Alleviate depressive/manic symptoms and return to improved levels of effective functioning per pt self report 3 out of 5 sessions documented.   Outcome: Progressing Intervention: REVIEW PLEASE SKILLS (TREAT PHYSICAL ILLNESS, BALANCE EATING, AVOID MOOD-ALTERING SUBSTANCES, BALANCE SLEEP AND GET EXERCISE) WITH Arbie Cookey "[name]" Note: Reviewed  Intervention: Encourage verbalization of feelings/concerns/expectations Note: Pt explored and expressed    Problem: Anxiety  Goal: Reduce overall frequency, intensity, and duration of the anxiety so that daily functioning is not impaired per pt self report 3 out of 5 sessions documented.   Outcome: Not Progressing Intervention: Assist with relaxation techniques, as appropriate (deep breathing exercises, meditation, guided imagery) Note: Reviewed and allowed pt to identify successful coping skills

## 2022-05-02 NOTE — Progress Notes (Signed)
Virtual Visit via Video Note  I connected with [name] on 03/23/22 at  9:00 AM EDT by a video enabled telemedicine application and verified that I am speaking with the correct person using two identifiers.  Location: Patient: home Provider: [place] Office   I discussed the limitations of evaluation and management by telemedicine and the availability of in person appointments. The patient expressed understanding and agreed to proceed.  I discussed the assessment and treatment plan with the patient. The patient was provided an opportunity to ask questions and all were answered. The patient agreed with the plan and demonstrated an understanding of the instructions.   The patient was advised to call back or seek an in-person evaluation if the symptoms worsen or if the condition fails to improve as anticipated.  I provided 40 minutes of non-face-to-face time during this encounter.   [place], LCSW   THERAPIST PROGRESS NOTE  Session Time: [id number]  Participation Level: Active  Behavioral Response: CasualAlertAnxious and Depressed  Type of Therapy: Individual Therapy  Treatment Goals addressed: Problem: Depression  Goal:  Decrease depressive symptoms and improve levels of effective functioning-pt reports a decrease in overall depression symptoms 3 out of 5 sessions documented.  Outcome: Progressing Goal: Alleviate depressive/manic symptoms and return to improved levels of effective functioning per pt self report 3 out of 5 sessions documented.   Outcome: Progressing Intervention: REVIEW PLEASE SKILLS (TREAT PHYSICAL ILLNESS, BALANCE EATING, AVOID MOOD-ALTERING SUBSTANCES, BALANCE SLEEP AND GET EXERCISE) WITH Sara Cookey "[name]" Note: Reviewed  Intervention: Encourage verbalization of feelings/concerns/expectations Note: Pt explored and expressed    Problem: Anxiety  Goal: Reduce overall frequency, intensity, and duration of the anxiety so  that daily functioning is not impaired per pt self report 3 out of 5 sessions documented.   Outcome: Not Progressing Goal: Learn and implement coping skills that result in a reduction of anxiety and worry, and improve daily functioning per pt report 3 out of 5 sessions documented  Outcome: Progressing Intervention: Assist with relaxation techniques, as appropriate (deep breathing exercises, meditation, guided imagery) Note: Reviewed and allowed pt to identify successful coping skills      ProgressTowards Goals: Progressing  Interventions: CBT, Motivational Interviewing, Solution Focused, and Other: trauma focused  Summary: [name] is a 71 y.o. female who presents with ongoing symptoms related to bipolar disorder symptoms. Pt reports that she has been feeling more anxious lately, but finds THC is the most helpful for her (manages chronic pain and anxiety management.   Allow patient to explore and express thoughts and feelings associated That's an external stressors. Pt reports that she is having someone come and clean her house, which is very helpful for her currently. Pt reports that she has felt more anxious when driving. Reviewed coping skills for managing stress/anxiety while driving. Pt reports that she is trying to be a "good driver".  Pt reports that she is being intentional about social engagement--has spent quality time with friends on a regular basis recently. Pt states that she made amends with the friend in [place] and had a direct conversation with her about the state her house was in (not clean). Pts friend is aware that this was the primary reason for cutting the trip short.   Reviewed coping skills for managing stress and anxiety symptoms. Pt reflects understanding and willingness to cooperate.   Continued recommendations are as follows: self care behaviors, positive social engagements, focusing on overall work/home/life balance, and focusing on positive physical and  emotional wellness.   Suicidal/Homicidal: No  Therapist Response: Pt is continuing to apply interventions learned in session into daily life situations. Pt is currently on track to meet goals utilizing interventions mentioned above. Personal growth and progress noted. Treatment to continue as indicated.   Plan: Return again in 4 weeks.  Diagnosis:  Encounter Diagnosis  Name Primary?   Bipolar II disorder, most recent episode major depressive ([place]) Yes   Collaboration of Care: Other Pt to follow up with Hal Morales for medication management of symptoms. Pt to continue with Clancy Gourd at pain clinic for chronic pain management.  Patient/Guardian was advised Release of Information must be obtained prior to any record release in order to collaborate their care with an outside provider. Patient/Guardian was advised if they have not already done so to contact the registration department to sign all necessary forms in order for [place] to release information regarding their care.   Consent: Patient/Guardian gives verbal consent for treatment and assignment of benefits for services provided during this visit. Patient/Guardian expressed understanding and agreed to proceed.   [place], LCSW 05/02/2022

## 2022-06-15 ENCOUNTER — Other Ambulatory Visit: Payer: Self-pay | Admitting: Family Medicine

## 2022-06-15 DIAGNOSIS — M8580 Other specified disorders of bone density and structure, unspecified site: Secondary | ICD-10-CM

## 2022-06-15 DIAGNOSIS — Z780 Asymptomatic menopausal state: Secondary | ICD-10-CM

## 2022-06-16 ENCOUNTER — Ambulatory Visit (INDEPENDENT_AMBULATORY_CARE_PROVIDER_SITE_OTHER): Payer: Medicare PPO | Admitting: Licensed Clinical Social Worker

## 2022-06-16 DIAGNOSIS — F3181 Bipolar II disorder: Secondary | ICD-10-CM

## 2022-06-16 NOTE — Progress Notes (Signed)
Virtual Visit via Video Note  I connected with [name] on 03/23/22 at 11:00 AM EST by a video enabled telemedicine application and verified that I am speaking with the correct person using two identifiers.  Location: Patient: home Provider: remote office [place], [place])   I discussed the limitations of evaluation and management by telemedicine and the availability of in person appointments. The patient expressed understanding and agreed to proceed.  I discussed the assessment and treatment plan with the patient. The patient was provided an opportunity to ask questions and all were answered. The patient agreed with the plan and demonstrated an understanding of the instructions.   The patient was advised to call back or seek an in-person evaluation if the symptoms worsen or if the condition fails to improve as anticipated.  I provided 40 minutes of non-face-to-face time during this encounter.   [place], LCSW   THERAPIST PROGRESS NOTE  Session Time: [id number]  Participation Level: Active  Behavioral Response: CasualAlertAnxious and Depressed  Type of Therapy: Individual Therapy  Treatment Goals addressed: Learn and implement coping skills that result in a reduction of anxiety and worry, and improve daily functioning per pt report 3 out of 5 sessions documented    ProgressTowards Goals: Progressing  Interventions: CBT, Motivational Interviewing, Solution Focused, and Other: trauma focused  Summary: [name] is a 71 y.o. female who presents with ongoing symptoms related to bipolar disorder symptoms.   Allowed pt to explore and express thoughts and feelings associated with recent life situations and external stressors.Patient reports that she is managing her mood well, and that she is also managing situational stressors well. Patient reports that she obtained Adderall from a friend recently, and really likes the way that it helps her feel focused and improves her  levels of concentration. Patient reports that she did discuss this with her psychiatrist at the time of her last appointment, and her psychiatrist stated that he would give her a prescription for Adderall. Discussed the pros and cons of stimulant use with patient. Patient reflects understanding. Discussed the potential need in the future for patient to get formal ADHD testing from a psychologist or attention specialist group. Patient reflects understanding.  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 Patient reports that her dogs (especially her dog, [name]) he's continuing to be her emotional support animal and is with patient all the time.  Discussed patients ongoing battle with chronic pain management--facial reports that she has acute pain in her elbow and chronic pain of her fibromyalgia all over her body. Patient states that she is going to stop using THC as a pain management option, and is hopeful that her pain management doctor we'll find a different option for her.  Patient denies any suicidal ideation, homicidal ideation, or perceptual disturbances at time of session.   Continued recommendations are as follows: self care behaviors, positive social engagements, focusing on overall work/home/life balance, and focusing on positive physical and emotional wellness.   Suicidal/Homicidal: No  Therapist Response: Pt is continuing to apply interventions learned in session into daily life situations. Pt is currently on track to meet goals utilizing interventions mentioned above. Personal growth and progress noted. Treatment to continue as indicated.   Plan: Return again in 4 weeks.  Diagnosis:  Encounter Diagnosis  Name Primary?   Bipolar II disorder, most recent episode major depressive ([place]) Yes    Collaboration of Care:  Other Pt to follow up with [name] for medication management of  symptoms. Pt to continue with [name] at pain clinic for chronic pain management.  Patient/Guardian was advised Release of Information must be obtained prior to any record release in order to collaborate their care with an outside provider. Patient/Guardian was advised if they have not already done so to contact the registration department to sign all necessary forms in order for [place] to release information regarding their care.   Consent: Patient/Guardian gives verbal consent for treatment and assignment of benefits for services provided during this visit. Patient/Guardian expressed understanding and agreed to proceed.   [place], LCSW 06/16/2022

## 2022-07-31 ENCOUNTER — Ambulatory Visit (INDEPENDENT_AMBULATORY_CARE_PROVIDER_SITE_OTHER): Payer: Medicare PPO | Admitting: Licensed Clinical Social Worker

## 2022-07-31 DIAGNOSIS — F3181 Bipolar II disorder: Secondary | ICD-10-CM

## 2022-07-31 NOTE — Progress Notes (Signed)
Virtual Visit via Video Note  I connected with Bobbe Medico on 03/23/22 at 11:00 AM EST by a video enabled telemedicine application and verified that I am speaking with the correct person using two identifiers.  Location: Patient: home Provider: remote office [place], [place])   I discussed the limitations of evaluation and management by telemedicine and the availability of in person appointments. The patient expressed understanding and agreed to proceed.  I discussed the assessment and treatment plan with the patient. The patient was provided an opportunity to ask questions and all were answered. The patient agreed with the plan and demonstrated an understanding of the instructions.   The patient was advised to call back or seek an in-person evaluation if the symptoms worsen or if the condition fails to improve as anticipated.  I provided 40 minutes of non-face-to-face time during this encounter.   [place], LCSW   THERAPIST PROGRESS NOTE  Session Time: [id number]  Participation Level: Active  Behavioral Response: CasualAlertAnxious and Depressed  Type of Therapy: Individual Therapy  Treatment Goals addressed: Learn and implement coping skills that result in a reduction of anxiety and worry, and improve daily functioning per pt report 3 out of 5 sessions documented    ProgressTowards Goals: Progressing  Interventions: CBT, Motivational Interviewing, Solution Focused, and Other: trauma focused  Summary: YENESIS EVEN is a 72 y.o. female who presents with ongoing symptoms related to bipolar disorder symptoms. Pt reports overall mood is stable and that she is managing situational stressors well.   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 Pt identifies this as part of her social engagement and "getting  out". Pt is doing something that she loves--theater.   Pt is recovering from Covid--feels that she didn't get it really bad because of vaccines. Pt reports other health-related concerns better.   Want to contact [place] [employer].  Discussed how anxious the plane ride was and needs refund from [place]. Discussed/identified steps that pt needs to take to eventually write the letter to send to [facility]. Encouraged pt to write bullet-point list. Pt agreed that she could work on this 15 min per day 2-3 x per week until complete.   Explored triggering dates: parents anniversary, death date of father. Negative thoughts of brother: triggers. Pt identified flashbacks.  Discussed nightmares/vivid dreams. "Not wanting to leave [place]". Allowed pt to process dream and discuss real-life scenarios from the period of time when she lived in [place] with her brother.   Explored finances: feeling good currently.  Discussed personal goals: historic district and funds to update house. Pt states that she has painted in the past but couldn't finish--has a friend that could help her out with the painting. Encouraged pt to make her environment more pleasing to herself both indoors and out.  Explored friend in [place] and Ship [employer] information.  Pt is worried about health of friend--keeps passing out/getting hospitalized. Pt feels it may be diabetes. Encouraged pt to continue staying in close contact to have knowledge about what's going on to counter worrying.  Received information that brother was going to die. July 14-21 rented cottage in [place] Going to visit friends in [place]. Childhood vacation cottage.  Allowed pt to identify specific positive memories from the past and encouraged pt to use them whenever having flashbacks about trip to [place].  Continued recommendations are  as follows: self care behaviors, positive social engagements, focusing on overall work/home/life balance, and  focusing on positive physical and emotional wellness.   Suicidal/Homicidal: No  Therapist Response: Pt is continuing to apply interventions learned in session into daily life situations. Pt is currently on track to meet goals utilizing interventions mentioned above. Personal growth and progress noted. Treatment to continue as indicated.   Plan: Return again in 4 weeks.  Diagnosis:  Encounter Diagnosis  Name Primary?   Bipolar II disorder, most recent episode major depressive ([place]) Yes    Collaboration of Care: Other Pt to follow up with Hal Morales for medication management of symptoms.   Patient/Guardian was advised Release of Information must be obtained prior to any record release in order to collaborate their care with an outside provider. Patient/Guardian was advised if they have not already done so to contact the registration department to sign all necessary forms in order for [place] to release information regarding their care.   Consent: Patient/Guardian gives verbal consent for treatment and assignment of benefits for services provided during this visit. Patient/Guardian expressed understanding and agreed to proceed.   [place], LCSW 07/31/2022

## 2022-09-05 ENCOUNTER — Ambulatory Visit (INDEPENDENT_AMBULATORY_CARE_PROVIDER_SITE_OTHER): Payer: Medicare Other | Admitting: Licensed Clinical Social Worker

## 2022-09-05 DIAGNOSIS — F3181 Bipolar II disorder: Secondary | ICD-10-CM | POA: Diagnosis not present
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
## 2022-09-14 ENCOUNTER — Ambulatory Visit
Admission: RE | Admit: 2022-09-14 | Discharge: 2022-09-14 | Disposition: A | Discharge status: Home / Self Care | Payer: Medicare Other | Source: Ambulatory Visit | Attending: Family Medicine | Admitting: Family Medicine

## 2022-09-14 DIAGNOSIS — M8580 Other specified disorders of bone density and structure, unspecified site: Secondary | ICD-10-CM | POA: Diagnosis present

## 2022-09-14 DIAGNOSIS — Z780 Asymptomatic menopausal state: Secondary | ICD-10-CM | POA: Diagnosis present

## 2022-10-30 ENCOUNTER — Ambulatory Visit (INDEPENDENT_AMBULATORY_CARE_PROVIDER_SITE_OTHER): Payer: Medicare Other | Admitting: Licensed Clinical Social Worker

## 2022-10-30 DIAGNOSIS — F3181 Bipolar II disorder: Secondary | ICD-10-CM

## 2022-10-30 NOTE — Progress Notes (Signed)
Virtual Visit via Video Note  I connected with [name] on 10/30/22 at  1:00 PM EDT by a video enabled telemedicine application and verified that I am speaking with the correct person using two identifiers.  Location: Patient: home Provider: remote office [place], [place])   I discussed the limitations of evaluation and management by telemedicine and the availability of in person appointments. The patient expressed understanding and agreed to proceed.  I discussed the assessment and treatment plan with the patient. The patient was provided an opportunity to ask questions and all were answered. The patient agreed with the plan and demonstrated an understanding of the instructions.   The patient was advised to call back or seek an in-person evaluation if the symptoms worsen or if the condition fails to improve as anticipated.  I provided 60 minutes of non-face-to-face time during this encounter.   Oland Arquette R Kylah Maresh, LCSW   THERAPIST PROGRESS NOTE  Session Time: 1-2P  Participation Level: Active  Behavioral Response: CasualAlertAnxious and Depressed  Type of Therapy: Individual Therapy  Treatment Goals addressed: Learn and implement coping skills that result in a reduction of anxiety and worry, and improve daily functioning per pt report 3 out of 5 sessions documented    ProgressTowards Goals: Progressing  Interventions: CBT, Motivational Interviewing, Solution Focused, and Other: trauma focused  Summary: [name] is a 72 y.o. female who presents with ongoing symptoms related to bipolar disorder symptoms. Pt reports overall mood is stable and that she is managing situational stressors well.   Clinician assisted pt with identifying current levels of chronic pain, and explored medical interventions for treating chronic pain.  Patient reports that she is aware that her THC use is a barrier to her getting additional pain management resources.  Discussed overall impact of pain  on daily activities, relationships, and ability/inability to engage in self care or recreational activities.  Patient is trying hard to engage in recreational activities, and is continuing to be involved in community theater, and spends time with peers on a regular basis.  Reviewed importance of keeping balance between rest and activity, and to continue communicating with medical providers for pain management. Reviewed mindfulness and meditation as relaxation interventions.   Discussed patient's current concerns about motivation, initiative, and inability to complete tasks around the house.  Clinician and patient reviewed importance of limiting distractions, avoid procrastination, and set various timers to improve motivation. Encouraged pt to take frequent (timed) breaks if involved in longer assignments or studying.   Discussed ongoing medical concerns-patient states that she is getting cataract surgery in a few weeks, and is looking forward to that.  Discussed weight loss--patient reports that she is continuing on the [employer] weight loss plan.  Continued recommendations are as follows: self care behaviors, positive social engagements, focusing on overall work/home/life balance, and focusing on positive physical and emotional wellness.   Suicidal/Homicidal: No  Therapist Response: Pt is continuing to apply interventions learned in session into daily life situations. Pt is currently on track to meet goals utilizing interventions mentioned above. Personal growth and progress noted. Treatment to continue as indicated.   Plan: Return again in 4 weeks.  Diagnosis:  Encounter Diagnosis  Name Primary?   Bipolar II disorder, most recent episode major depressive (HCC) Yes   Collaboration of Care: Other Pt to follow up with Annie Sable for medication management of symptoms.   Patient/Guardian was advised Release of Information must be obtained prior to any record release in order to collaborate their  care with an outside provider. Patient/Guardian was advised if they have not already done so to contact the registration department to sign all necessary forms in order for [place] to release information regarding their care.   Consent: Patient/Guardian gives verbal consent for treatment and assignment of benefits for services provided during this visit. Patient/Guardian expressed understanding and agreed to proceed.   Ernest Haber Denise Bramblett, LCSW 10/30/2022

## 2022-10-31 ENCOUNTER — Encounter: Payer: Self-pay | Admitting: Ophthalmology

## 2022-11-01 NOTE — Discharge Instructions (Signed)

## 2022-11-01 NOTE — Anesthesia Preprocedure Evaluation (Addendum)
Anesthesia Evaluation  Patient identified by MRN, date of birth, ID band Patient awake    Reviewed: Allergy & Precautions, H&P , NPO status , Patient's Chart, lab work & pertinent test results  Airway Mallampati: II  TM Distance: >3 FB Neck ROM: Full    Dental no notable dental hx.    Pulmonary neg pulmonary ROS, former smoker   Pulmonary exam normal breath sounds clear to auscultation       Cardiovascular hypertension, + angina  Normal cardiovascular exam+ dysrhythmias  Rhythm:Regular Rate:Normal     Neuro/Psych  PSYCHIATRIC DISORDERS      fibromyalgia negative neurological ROS  negative psych ROS   GI/Hepatic negative GI ROS, Neg liver ROS,,,  Endo/Other  negative endocrine ROS    Renal/GU Renal disease  negative genitourinary   Musculoskeletal  (+) Arthritis ,  Fibromyalgia -  Abdominal   Peds negative pediatric ROS (+)  Hematology negative hematology ROS (+)   Anesthesia Other Findings Hypertension  Depression Hypercholesterolemia  Bipolar disorder (HCC) Arthritis  Fibromyalgia History of kidney infection  S/P arthroscopy of right shoulder Dysrhythmia  Heart murmur Anginal pain (HCC)  Cancer (HCC) Chronic kidney disease  History of MRSA infection Leg wound, right  Diverticulosis Spinal stenosis of lumbosacral region Wears hearing aid in both ears    Reproductive/Obstetrics negative OB ROS                             Anesthesia Physical Anesthesia Plan  ASA: 3  Anesthesia Plan: MAC   Post-op Pain Management:    Induction: Intravenous  PONV Risk Score and Plan:   Airway Management Planned: Natural Airway and Nasal Cannula  Additional Equipment:   Intra-op Plan:   Post-operative Plan:   Informed Consent: I have reviewed the patients History and Physical, chart, labs and discussed the procedure including the risks, benefits and alternatives for the proposed  anesthesia with the patient or authorized representative who has indicated his/her understanding and acceptance.     Dental Advisory Given  Plan Discussed with: Anesthesiologist, CRNA and Surgeon  Anesthesia Plan Comments: (Patient consented for risks of anesthesia including but not limited to:  - adverse reactions to medications - damage to eyes, teeth, lips or other oral mucosa - nerve damage due to positioning  - sore throat or hoarseness - Damage to heart, brain, nerves, lungs, other parts of body or loss of life  Patient voiced understanding.)       Anesthesia Quick Evaluation

## 2022-11-06 ENCOUNTER — Ambulatory Visit: Payer: Medicare Other | Admitting: Anesthesiology

## 2022-11-06 ENCOUNTER — Other Ambulatory Visit: Payer: Self-pay

## 2022-11-06 ENCOUNTER — Encounter: Payer: Self-pay | Admitting: Ophthalmology

## 2022-11-06 ENCOUNTER — Ambulatory Visit
Admission: RE | Admit: 2022-11-06 | Discharge: 2022-11-06 | Disposition: A | Discharge status: Home / Self Care | Payer: Medicare Other | Attending: Ophthalmology | Admitting: Ophthalmology

## 2022-11-06 ENCOUNTER — Encounter
Admission: RE | Disposition: A | Discharge status: Home / Self Care | Payer: Self-pay | Source: Home / Self Care | Attending: Ophthalmology

## 2022-11-06 DIAGNOSIS — I440 Atrioventricular block, first degree: Secondary | ICD-10-CM | POA: Insufficient documentation

## 2022-11-06 DIAGNOSIS — Z9884 Bariatric surgery status: Secondary | ICD-10-CM | POA: Insufficient documentation

## 2022-11-06 DIAGNOSIS — I10 Essential (primary) hypertension: Secondary | ICD-10-CM | POA: Insufficient documentation

## 2022-11-06 DIAGNOSIS — N189 Chronic kidney disease, unspecified: Secondary | ICD-10-CM | POA: Diagnosis not present

## 2022-11-06 DIAGNOSIS — M797 Fibromyalgia: Secondary | ICD-10-CM | POA: Diagnosis not present

## 2022-11-06 DIAGNOSIS — E7800 Pure hypercholesterolemia, unspecified: Secondary | ICD-10-CM | POA: Insufficient documentation

## 2022-11-06 DIAGNOSIS — Z8614 Personal history of Methicillin resistant Staphylococcus aureus infection: Secondary | ICD-10-CM | POA: Diagnosis not present

## 2022-11-06 DIAGNOSIS — I129 Hypertensive chronic kidney disease with stage 1 through stage 4 chronic kidney disease, or unspecified chronic kidney disease: Secondary | ICD-10-CM | POA: Insufficient documentation

## 2022-11-06 DIAGNOSIS — F319 Bipolar disorder, unspecified: Secondary | ICD-10-CM | POA: Diagnosis not present

## 2022-11-06 DIAGNOSIS — Z87891 Personal history of nicotine dependence: Secondary | ICD-10-CM | POA: Insufficient documentation

## 2022-11-06 DIAGNOSIS — Z79899 Other long term (current) drug therapy: Secondary | ICD-10-CM | POA: Insufficient documentation

## 2022-11-06 DIAGNOSIS — H2511 Age-related nuclear cataract, right eye: Secondary | ICD-10-CM | POA: Diagnosis present

## 2022-11-06 DIAGNOSIS — Z96653 Presence of artificial knee joint, bilateral: Secondary | ICD-10-CM | POA: Diagnosis not present

## 2022-11-06 HISTORY — DX: Presence of external hearing-aid: Z97.4

## 2022-11-06 HISTORY — PX: CATARACT EXTRACTION W/PHACO: SHX586

## 2022-11-06 SURGERY — PHACOEMULSIFICATION, CATARACT, WITH IOL INSERTION
Anesthesia: Monitor Anesthesia Care | Site: Eye | Laterality: Right

## 2022-11-06 MED ORDER — NEOMYCIN-POLYMYXIN-DEXAMETH 3.5-10000-0.1 OP OINT
TOPICAL_OINTMENT | OPHTHALMIC | Status: DC | PRN
  Administered 2022-11-06: 1 via OPHTHALMIC

## 2022-11-06 MED ORDER — TETRACAINE HCL 0.5 % OP SOLN
1.0000 [drp] | OPHTHALMIC | Status: DC | PRN
  Administered 2022-11-06 (×3): 1 [drp] via OPHTHALMIC

## 2022-11-06 MED ORDER — FENTANYL CITRATE (PF) 100 MCG/2ML IJ SOLN
INTRAMUSCULAR | Status: DC | PRN
  Administered 2022-11-06 (×2): 50 ug via INTRAVENOUS

## 2022-11-06 MED ORDER — ARMC OPHTHALMIC DILATING DROPS
1.0000 | OPHTHALMIC | Status: DC | PRN
  Administered 2022-11-06 (×3): 1 via OPHTHALMIC

## 2022-11-06 MED ORDER — OXYCODONE HCL 5 MG PO TABS: 5.0000 mg | ORAL_TABLET | Freq: Four times a day (QID) | ORAL | 0 refills | Status: AC | PRN

## 2022-11-06 MED ORDER — SIGHTPATH DOSE#1 BSS IO SOLN
INTRAOCULAR | Status: DC | PRN
  Administered 2022-11-06: 97 mL via OPHTHALMIC

## 2022-11-06 MED ORDER — SIGHTPATH DOSE#1 NA HYALUR & NA CHOND-NA HYALUR IO KIT
PACK | INTRAOCULAR | Status: DC | PRN
  Administered 2022-11-06: 1 via OPHTHALMIC

## 2022-11-06 MED ORDER — MOXIFLOXACIN HCL 0.5 % OP SOLN
OPHTHALMIC | Status: DC | PRN
  Administered 2022-11-06: .2 mL via OPHTHALMIC

## 2022-11-06 MED ORDER — SIGHTPATH DOSE#1 BSS IO SOLN
INTRAOCULAR | Status: DC | PRN
  Administered 2022-11-06: 15 mL

## 2022-11-06 MED ORDER — MIDAZOLAM HCL 2 MG/2ML IJ SOLN
INTRAMUSCULAR | Status: DC | PRN
  Administered 2022-11-06 (×2): 1 mg via INTRAVENOUS

## 2022-11-06 MED ORDER — LIDOCAINE HCL (PF) 2 % IJ SOLN
INTRAOCULAR | Status: DC | PRN
  Administered 2022-11-06: 1 mL via INTRAOCULAR

## 2022-11-06 MED ORDER — BSS IO SOLN
INTRAOCULAR | Status: DC | PRN
  Administered 2022-11-06: 15 mL

## 2022-11-06 SURGICAL SUPPLY — 19 items
BNDG EYE OVAL 2 1/8 X 2 5/8 (GAUZE/BANDAGES/DRESSINGS) IMPLANT
CANNULA ANT/CHMB 27G (MISCELLANEOUS) IMPLANT
CANNULA ANT/CHMB 27GA (MISCELLANEOUS) IMPLANT
CATARACT SUITE SIGHTPATH (MISCELLANEOUS) ×1 IMPLANT
DISSECTOR HYDRO NUCLEUS 50X22 (MISCELLANEOUS) ×1 IMPLANT
FEE CATARACT SUITE SIGHTPATH (MISCELLANEOUS) ×1 IMPLANT
GLOVE SURG GAMMEX PI TX LF 7.5 (GLOVE) ×1 IMPLANT
GLOVE SURG SYN 8.5  E (GLOVE) ×1
GLOVE SURG SYN 8.5 E (GLOVE) ×1 IMPLANT
GLOVE SURG SYN 8.5 PF PI (GLOVE) ×1 IMPLANT
LENS IOL TECNIS EYHANCE 19.0 (Intraocular Lens) IMPLANT
NDL FILTER BLUNT 18X1 1/2 (NEEDLE) ×1 IMPLANT
NEEDLE FILTER BLUNT 18X1 1/2 (NEEDLE) ×1 IMPLANT
PACK VIT ANT 23G (MISCELLANEOUS) IMPLANT
RING MALYGIN (MISCELLANEOUS) IMPLANT
SUT ETHILON 10-0 CS-B-6CS-B-6 (SUTURE)
SUTURE EHLN 10-0 CS-B-6CS-B-6 (SUTURE) IMPLANT
SYR 3ML LL SCALE MARK (SYRINGE) ×1 IMPLANT
SYR 5ML LL (SYRINGE) ×1 IMPLANT
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
## 2022-11-06 NOTE — Op Note (Signed)
OPERATIVE NOTE  [name] [id number] 11/06/2022   PREOPERATIVE DIAGNOSIS:  Nuclear sclerotic cataract right eye.  H25.11   POSTOPERATIVE DIAGNOSIS:    Nuclear sclerotic cataract right eye.     PROCEDURE:  Phacoemusification with posterior chamber intraocular lens placement of the right eye   LENS:   Implant Name Type Inv. Item Serial No. Manufacturer Lot No. LRB No. Used Action  LENS IOL TECNIS EYHANCE 19.0 - [id number] Intraocular Lens LENS IOL TECNIS EYHANCE 19.0 [id number] [employer]  Right 1 Implanted       Procedure(s) with comments: CATARACT EXTRACTION PHACO AND INTRAOCULAR LENS PLACEMENT (IOC) RIGHT (Right) - 3.33 0:37.0  DIB00 +19.0   ULTRASOUND TIME: 0 minutes 37 seconds.  CDE 3.33   SURGEON:  Willey Blade, MD, MPH  ANESTHESIOLOGIST: Anesthesiologist: Marisue Humble, MD CRNA: Emeterio Reeve, CRNA   ANESTHESIA:  Topical with tetracaine drops augmented with 1% preservative-free intracameral lidocaine.  ESTIMATED BLOOD LOSS: less than 1 mL.   COMPLICATIONS:  None.   DESCRIPTION OF PROCEDURE:  The patient was identified in the holding room and transported to the operating room and placed in the supine position under the operating microscope.  The right eye was identified as the operative eye and it was prepped and draped in the usual sterile ophthalmic fashion.   A 1.0 millimeter clear-corneal paracentesis was made at the 10:30 position. 0.5 ml of preservative-free 1% lidocaine with epinephrine was injected into the anterior chamber.  The anterior chamber was filled with viscoelastic.  A 2.4 millimeter keratome was used to make a near-clear corneal incision at the 8:00 position.  A curvilinear capsulorrhexis was made with a cystotome and capsulorrhexis forceps.  Balanced salt solution was used to hydrodissect and hydrodelineate the nucleus.   Phacoemulsification was then used in stop and chop fashion to remove the lens nucleus and epinucleus.  The remaining cortex  was then removed using the irrigation and aspiration handpiece. Viscoelastic was then placed into the capsular bag to distend it for lens placement.  A lens was then injected into the capsular bag.  The remaining viscoelastic was aspirated.   Wounds were hydrated with balanced salt solution.  The anterior chamber was inflated to a physiologic pressure with balanced salt solution.   Intracameral vigamox 0.1 mL undiluted was injected into the eye and a drop placed onto the ocular surface.  There was a 2-3 mm corneal abrasion on the right eye.  No wound leaks were noted.    Maxitrol and a patch and shield were placed on the eye. The patient was taken to the recovery room in stable condition without complications of anesthesia or surgery  [name] 11/06/2022, 10:52 AM

## 2022-11-06 NOTE — Anesthesia Postprocedure Evaluation (Signed)
Anesthesia Post Note  Patient: [name]  Procedure(s) Performed: CATARACT EXTRACTION PHACO AND INTRAOCULAR LENS PLACEMENT (IOC) RIGHT (Right: Eye)  Patient location during evaluation: PACU Anesthesia Type: MAC Level of consciousness: awake and alert Pain management: pain level controlled Vital Signs Assessment: post-procedure vital signs reviewed and stable Respiratory status: spontaneous breathing, nonlabored ventilation, respiratory function stable and patient connected to nasal cannula oxygen Cardiovascular status: stable and blood pressure returned to baseline Postop Assessment: no apparent nausea or vomiting Anesthetic complications: no   No notable events documented.   Last Vitals:  Vitals:   11/06/22 1059 11/06/22 1102  BP:  (!) 143/80  Pulse: 66 62  Resp: 17 16  Temp:    SpO2: 93% 94%    Last Pain:  Vitals:   11/06/22 1102  TempSrc:   PainSc: 0-No pain                 Annalysia Willenbring C Lilias Lorensen

## 2022-11-06 NOTE — Transfer of Care (Signed)
Immediate Anesthesia Transfer of Care Note  Patient: [name]  Procedure(s) Performed: CATARACT EXTRACTION PHACO AND INTRAOCULAR LENS PLACEMENT (IOC) RIGHT (Right: Eye)  Patient Location: PACU  Anesthesia Type: MAC  Level of Consciousness: awake, alert  and patient cooperative  Airway and Oxygen Therapy: Patient Spontanous Breathing and Patient connected to supplemental oxygen  Post-op Assessment: Post-op Vital signs reviewed, Patient's Cardiovascular Status Stable, Respiratory Function Stable, Patent Airway and No signs of Nausea or vomiting  Post-op Vital Signs: Reviewed and stable  Complications: No notable events documented.

## 2022-11-07 ENCOUNTER — Encounter: Payer: Self-pay | Admitting: Ophthalmology

## 2022-11-17 NOTE — Discharge Instructions (Signed)

## 2022-11-20 ENCOUNTER — Encounter
Admission: RE | Disposition: A | Discharge status: Home / Self Care | Payer: Self-pay | Source: Home / Self Care | Attending: Ophthalmology

## 2022-11-20 ENCOUNTER — Ambulatory Visit: Payer: Medicare Other | Admitting: Anesthesiology

## 2022-11-20 ENCOUNTER — Ambulatory Visit
Admission: RE | Admit: 2022-11-20 | Discharge: 2022-11-20 | Disposition: A | Discharge status: Home / Self Care | Payer: Medicare Other | Attending: Ophthalmology | Admitting: Ophthalmology

## 2022-11-20 ENCOUNTER — Other Ambulatory Visit: Payer: Self-pay

## 2022-11-20 ENCOUNTER — Encounter: Payer: Self-pay | Admitting: Ophthalmology

## 2022-11-20 DIAGNOSIS — Z87891 Personal history of nicotine dependence: Secondary | ICD-10-CM | POA: Insufficient documentation

## 2022-11-20 DIAGNOSIS — I129 Hypertensive chronic kidney disease with stage 1 through stage 4 chronic kidney disease, or unspecified chronic kidney disease: Secondary | ICD-10-CM | POA: Diagnosis not present

## 2022-11-20 DIAGNOSIS — N189 Chronic kidney disease, unspecified: Secondary | ICD-10-CM | POA: Diagnosis not present

## 2022-11-20 DIAGNOSIS — F319 Bipolar disorder, unspecified: Secondary | ICD-10-CM | POA: Insufficient documentation

## 2022-11-20 DIAGNOSIS — H2512 Age-related nuclear cataract, left eye: Secondary | ICD-10-CM | POA: Insufficient documentation

## 2022-11-20 HISTORY — PX: CATARACT EXTRACTION W/PHACO: SHX586

## 2022-11-20 SURGERY — PHACOEMULSIFICATION, CATARACT, WITH IOL INSERTION
Anesthesia: Monitor Anesthesia Care | Site: Eye | Laterality: Left

## 2022-11-20 MED ORDER — TETRACAINE HCL 0.5 % OP SOLN
1.0000 [drp] | OPHTHALMIC | Status: DC | PRN
  Administered 2022-11-20 (×3): 1 [drp] via OPHTHALMIC

## 2022-11-20 MED ORDER — PROPOFOL 10 MG/ML IV BOLUS
INTRAVENOUS | Status: DC | PRN
  Administered 2022-11-20: 15 mg via INTRAVENOUS
  Administered 2022-11-20: 20 mg via INTRAVENOUS
  Administered 2022-11-20: 15 mg via INTRAVENOUS
  Administered 2022-11-20: 10 mg via INTRAVENOUS

## 2022-11-20 MED ORDER — SIGHTPATH DOSE#1 BSS IO SOLN
INTRAOCULAR | Status: DC | PRN
  Administered 2022-11-20: 72 mL via OPHTHALMIC

## 2022-11-20 MED ORDER — FENTANYL CITRATE (PF) 100 MCG/2ML IJ SOLN
INTRAMUSCULAR | Status: DC | PRN
  Administered 2022-11-20 (×2): 50 ug via INTRAVENOUS

## 2022-11-20 MED ORDER — ARMC OPHTHALMIC DILATING DROPS
1.0000 | OPHTHALMIC | Status: DC | PRN
  Administered 2022-11-20 (×3): 1 via OPHTHALMIC

## 2022-11-20 MED ORDER — MOXIFLOXACIN HCL 0.5 % OP SOLN
OPHTHALMIC | Status: DC | PRN
  Administered 2022-11-20: .2 mL via OPHTHALMIC

## 2022-11-20 MED ORDER — MIDAZOLAM HCL 2 MG/2ML IJ SOLN
INTRAMUSCULAR | Status: DC | PRN
  Administered 2022-11-20: 2 mg via INTRAVENOUS

## 2022-11-20 MED ORDER — OXYCODONE HCL 5 MG PO TABS
10.0000 mg | ORAL_TABLET | Freq: Once | ORAL | Status: AC
  Administered 2022-11-20: 10 mg via ORAL

## 2022-11-20 MED ORDER — LIDOCAINE HCL (PF) 2 % IJ SOLN
INTRAOCULAR | Status: DC | PRN
  Administered 2022-11-20: 4 mL via INTRAOCULAR

## 2022-11-20 MED ORDER — LACTATED RINGERS IV SOLN: INTRAVENOUS | Status: DC

## 2022-11-20 MED ORDER — SIGHTPATH DOSE#1 NA HYALUR & NA CHOND-NA HYALUR IO KIT
PACK | INTRAOCULAR | Status: DC | PRN
  Administered 2022-11-20: 1 via OPHTHALMIC

## 2022-11-20 MED ORDER — SIGHTPATH DOSE#1 BSS IO SOLN
INTRAOCULAR | Status: DC | PRN
  Administered 2022-11-20: 15 mL via INTRAOCULAR

## 2022-11-20 SURGICAL SUPPLY — 18 items
CANNULA ANT/CHMB 27G (MISCELLANEOUS) IMPLANT
CANNULA ANT/CHMB 27GA (MISCELLANEOUS) IMPLANT
CATARACT SUITE SIGHTPATH (MISCELLANEOUS) ×1 IMPLANT
DISSECTOR HYDRO NUCLEUS 50X22 (MISCELLANEOUS) ×1 IMPLANT
FEE CATARACT SUITE SIGHTPATH (MISCELLANEOUS) ×1 IMPLANT
GLOVE SURG GAMMEX PI TX LF 7.5 (GLOVE) ×1 IMPLANT
GLOVE SURG SYN 8.5  E (GLOVE) ×1
GLOVE SURG SYN 8.5 E (GLOVE) ×1 IMPLANT
GLOVE SURG SYN 8.5 PF PI (GLOVE) ×1 IMPLANT
LENS IOL TECNIS EYHANCE 19.0 (Intraocular Lens) IMPLANT
NDL FILTER BLUNT 18X1 1/2 (NEEDLE) ×1 IMPLANT
NEEDLE FILTER BLUNT 18X1 1/2 (NEEDLE) ×1 IMPLANT
PACK VIT ANT 23G (MISCELLANEOUS) IMPLANT
RING MALYGIN (MISCELLANEOUS) IMPLANT
SUT ETHILON 10-0 CS-B-6CS-B-6 (SUTURE)
SUTURE EHLN 10-0 CS-B-6CS-B-6 (SUTURE) IMPLANT
SYR 3ML LL SCALE MARK (SYRINGE) ×1 IMPLANT
SYR 5ML LL (SYRINGE) ×1 IMPLANT

## 2022-11-20 NOTE — Anesthesia Postprocedure Evaluation (Signed)
Anesthesia Post Note  Patient: [name]  Procedure(s) Performed: CATARACT EXTRACTION PHACO AND INTRAOCULAR LENS PLACEMENT (IOC) LEFT 3.40 00:28.7 (Left: Eye)  Patient location during evaluation: PACU Anesthesia Type: MAC Level of consciousness: awake and alert Pain management: pain level controlled Vital Signs Assessment: post-procedure vital signs reviewed and stable Respiratory status: spontaneous breathing, nonlabored ventilation, respiratory function stable and patient connected to nasal cannula oxygen Cardiovascular status: stable and blood pressure returned to baseline Postop Assessment: no apparent nausea or vomiting Anesthetic complications: no   No notable events documented.   Last Vitals:  Vitals:   11/20/22 1018 11/20/22 1026  BP: 121/66 132/76  Pulse: 65 66  Resp: 14 20  Temp: (!) 36.4 C (!) 36.4 C  SpO2: 97% 97%    Last Pain:  Vitals:   11/20/22 1026  TempSrc:   PainSc: 0-No pain                 Reona Zendejas C Elzie Knisley

## 2022-11-20 NOTE — Anesthesia Preprocedure Evaluation (Signed)
Anesthesia Evaluation  Patient identified by MRN, date of birth, ID band Patient awake    Reviewed: Allergy & Precautions, H&P , NPO status , Patient's Chart, lab work & pertinent test results  Airway Mallampati: II  TM Distance: >3 FB Neck ROM: Full    Dental no notable dental hx.    Pulmonary neg pulmonary ROS, Patient abstained from smoking., former smoker   Pulmonary exam normal breath sounds clear to auscultation       Cardiovascular hypertension, + angina  Normal cardiovascular exam+ dysrhythmias + Valvular Problems/Murmurs  Rhythm:Regular Rate:Normal     Neuro/Psych    Depression Bipolar Disorder   negative neurological ROS  negative psych ROS   GI/Hepatic negative GI ROS, Neg liver ROS,,,  Endo/Other  negative endocrine ROS    Renal/GU Renal disease  negative genitourinary   Musculoskeletal negative musculoskeletal ROS (+)    Abdominal   Peds negative pediatric ROS (+)  Hematology negative hematology ROS (+)   Anesthesia Other Findings   Reproductive/Obstetrics negative OB ROS                             Anesthesia Physical Anesthesia Plan  ASA: 3  Anesthesia Plan: MAC   Post-op Pain Management:    Induction: Intravenous  PONV Risk Score and Plan:   Airway Management Planned: Natural Airway and Nasal Cannula  Additional Equipment:   Intra-op Plan:   Post-operative Plan:   Informed Consent: I have reviewed the patients History and Physical, chart, labs and discussed the procedure including the risks, benefits and alternatives for the proposed anesthesia with the patient or authorized representative who has indicated his/her understanding and acceptance.     Dental Advisory Given  Plan Discussed with: Anesthesiologist, CRNA and Surgeon  Anesthesia Plan Comments: (Patient consented for risks of anesthesia including but not limited to:  - adverse reactions to  medications - damage to eyes, teeth, lips or other oral mucosa - nerve damage due to positioning  - sore throat or hoarseness - Damage to heart, brain, nerves, lungs, other parts of body or loss of life  Patient voiced understanding.)       Anesthesia Quick Evaluation

## 2022-11-20 NOTE — Transfer of Care (Signed)
Immediate Anesthesia Transfer of Care Note  Patient: [name]  Procedure(s) Performed: CATARACT EXTRACTION PHACO AND INTRAOCULAR LENS PLACEMENT (IOC) LEFT 3.40 00:28.7 (Left: Eye)  Patient Location: PACU  Anesthesia Type: MAC  Level of Consciousness: awake, alert  and patient cooperative  Airway and Oxygen Therapy: Patient Spontanous Breathing and Patient connected to supplemental oxygen  Post-op Assessment: Post-op Vital signs reviewed, Patient's Cardiovascular Status Stable, Respiratory Function Stable, Patent Airway and No signs of Nausea or vomiting  Post-op Vital Signs: Reviewed and stable  Complications: No notable events documented.

## 2022-11-20 NOTE — Op Note (Signed)
OPERATIVE NOTE  [name] [id number] 11/20/2022   PREOPERATIVE DIAGNOSIS:  Nuclear sclerotic cataract left eye.  H25.12   POSTOPERATIVE DIAGNOSIS:    Nuclear sclerotic cataract left eye.     PROCEDURE:  Phacoemusification with posterior chamber intraocular lens placement of the left eye   LENS:   Implant Name Type Inv. Item Serial No. Manufacturer Lot No. LRB No. Used Action  LENS IOL TECNIS EYHANCE 19.0 - [id number] Intraocular Lens LENS IOL TECNIS EYHANCE 19.0 [id number] [employer]  Left 1 Implanted      Procedure(s): CATARACT EXTRACTION PHACO AND INTRAOCULAR LENS PLACEMENT (IOC) LEFT 3.40 00:28.7 (Left)  DIB00 +19.0   ULTRASOUND TIME: 0 minutes 28 seconds.  CDE 3.40   SURGEON:  Willey Blade, MD, MPH   ANESTHESIA:  Topical with tetracaine drops augmented with 1% preservative-free intracameral lidocaine.  ESTIMATED BLOOD LOSS: <1 mL   COMPLICATIONS:  None.   DESCRIPTION OF PROCEDURE:  The patient was identified in the holding room and transported to the operating room and placed in the supine position under the operating microscope.  The left eye was identified as the operative eye and it was prepped and draped in the usual sterile ophthalmic fashion.   A 1.0 millimeter clear-corneal paracentesis was made at the 5:00 position. 0.5 ml of preservative-free 1% lidocaine with epinephrine was injected into the anterior chamber.  The anterior chamber was filled with viscoelastic.  A 2.4 millimeter keratome was used to make a near-clear corneal incision at the 2:00 position.  A curvilinear capsulorrhexis was made with a cystotome and capsulorrhexis forceps.  Balanced salt solution was used to hydrodissect and hydrodelineate the nucleus.   Phacoemulsification was then used in stop and chop fashion to remove the lens nucleus and epinucleus.  The remaining cortex was then removed using the irrigation and aspiration handpiece. Viscoelastic was then placed into the capsular bag to distend  it for lens placement.  A lens was then injected into the capsular bag.  The remaining viscoelastic was aspirated.   Wounds were hydrated with balanced salt solution.  The anterior chamber was inflated to a physiologic pressure with balanced salt solution.  Intracameral vigamox 0.1 mL undiltued was injected into the eye and a drop placed onto the ocular surface.  No wound leaks were noted.  The patient was taken to the recovery room in stable condition without complications of anesthesia or surgery  [name] 11/20/2022, 10:17 AM

## 2022-11-20 NOTE — H&P (Signed)
[facility]   Primary Care Physician:  Rolm Gala, MD Ophthalmologist: Dr. Willey Blade  Pre-Procedure History & Physical: HPI:  [name] is a 72 y.o. female here for cataract surgery.   Past Medical History:  Diagnosis Date   Anginal pain (HCC) 2019   stable but patient denies.   Arthritis    Bipolar disorder (HCC)    Cancer (HCC) 12/2017   precancerous spot burnt off of face recently   Chronic kidney disease 2012   mrsa in kidneys caused coma and a trip to the icu   Depression    Diverticulosis    Dysrhythmia 12/2017   1st degree AV block. followed by dr. Gwen Pounds   Fibromyalgia    Heart murmur 2019   followed by dr. Gwen Pounds   History of kidney infection    History of MRSA infection 2011   kidney   Hypercholesterolemia    Hypertension    Leg wound, right 12/2017   draining sore . provided with antibiotics   S/P arthroscopy of right shoulder 11/2010   Spinal stenosis of lumbosacral region    Wears hearing aid in both ears     Past Surgical History:  Procedure Laterality Date   ABLATION  1998   uterine   APPENDECTOMY  1957   CATARACT EXTRACTION W/PHACO Right 11/06/2022   Procedure: CATARACT EXTRACTION PHACO AND INTRAOCULAR LENS PLACEMENT (IOC) RIGHT;  Surgeon: Nevada Crane, MD;  Location: [facility];  Service: Ophthalmology;  Laterality: Right;  3.33 0:37.0   CERVICAL LAMINECTOMY Left 2004   neck metal   COLONOSCOPY     ESOPHAGOGASTRODUODENOSCOPY  10/28/2012   FINGER SURGERY Bilateral 1989   thumb replacement with titanium hardware (bilateral)   GASTRIC BYPASS  2014   no ibuprofen please   JOINT REPLACEMENT     LASIK Bilateral    REPLACEMENT TOTAL KNEE BILATERAL Bilateral 2002   RHINOPLASTY  1990   ROTATOR CUFF REPAIR Right 11/2010   SHOULDER ARTHROSCOPY WITH BICEPSTENOTOMY Left 11/08/2016   Procedure: SHOULDER ARTHROSCOPY WITH BICEPSTENOTOMY;  Surgeon: Deeann Saint, MD;  Location: [facility];  Service: Orthopedics;  Laterality:  Left;   TONSILLECTOMY     TOTAL SHOULDER ARTHROPLASTY Left 05/01/2018   Procedure: TOTAL SHOULDER ARTHROPLASTY;  Surgeon: Lyndle Herrlich, MD;  Location: [facility];  Service: Orthopedics;  Laterality: Left;   TRIGGER FINGER RELEASE  2017   bilateral ring fingers    Prior to Admission medications   Medication Sig Start Date End Date Taking? Authorizing Provider  alendronate (FOSAMAX) 70 MG tablet Take 70 mg by mouth once a week. Take with a full glass of water on an empty stomach.   Yes [provider]  amLODipine (NORVASC) 10 MG tablet Take 5 mg by mouth daily. 08/11/16  Yes [provider]  amphetamine-dextroamphetamine (ADDERALL) 15 MG tablet Take 15 mg by mouth daily.   Yes [provider]  Calcium-Vitamin D 600-5 MG-MCG TABS Take by mouth daily.   Yes [provider]  cholecalciferol (VITAMIN D) 1000 units tablet Take 2,000 Units by mouth daily.    Yes [provider]  Dextran 70-Hypromellose (ARTIFICIAL TEARS) 0.1-0.3 % SOLN Apply to eye.   Yes [provider]  Difluprednate (DUREZOL) 0.05 % EMUL Apply to eye.   Yes [provider]  DULoxetine (CYMBALTA) 60 MG capsule Take 60 mg by mouth at bedtime.    Yes [provider]  Fluticasone Propionate (FLONASE NA) Place into the nose.   Yes [provider]  gabapentin (NEURONTIN) 100 MG capsule Take 200 mg by mouth 3 (three) times daily.   Yes [provider]  lamoTRIgine (LAMICTAL) 200 MG tablet Take 200 mg by mouth 2 (two) times daily. 08/02/16  Yes [provider]  lisinopril (ZESTRIL) 10 MG tablet Take 10 mg by mouth daily.   Yes [provider]  lovastatin (MEVACOR) 20 MG tablet Take 40 mg by mouth at bedtime. 09/21/16  Yes [provider]  meloxicam (MOBIC) 7.5 MG tablet Take 15 mg by mouth daily as needed for pain.   Yes [provider]  Multiple Vitamins-Minerals (PRESERVISION AREDS 2 PO) Take 1 capsule by mouth 2  (two) times daily.   Yes [provider]  Omega-3 Fatty Acids (FISH OIL) 1000 MG CAPS Take 1,000 mg by mouth at bedtime.   Yes [provider]  omeprazole (PRILOSEC) 40 MG capsule Take 40 mg by mouth daily.   Yes [provider]  Bee Pollen 580 MG CAPS Take by mouth daily. Patient not taking: Reported on 11/06/2022    [provider]  Misc Natural Products (GLUCOS-CHONDROIT-MSM COMPLEX) TABS Take 1 tablet by mouth 2 (two) times daily. Patient not taking: Reported on 10/31/2022    [provider]  moxifloxacin (VIGAMOX) 0.5 % ophthalmic solution 1 drop 4 (four) times daily.    [provider]    Allergies as of 10/10/2022 - Review Complete 05/01/2018  Allergen Reaction Noted   Other Other (See Comments) 01/21/2018   Effexor [venlafaxine] Hives 10/26/2016   Wellbutrin [bupropion] Hives 10/26/2016    Family History  Problem Relation Age of Onset   Diabetes Mother     Social History   Socioeconomic History   Marital status: Single    Spouse name: Not on file   Number of children: Not on file   Years of education: Not on file   Highest education level: Not on file  Occupational History   Not on file  Tobacco Use   Smoking status: Former    Packs/day: 2.00    Years: 50.00    Additional pack years: 0.00    Total pack years: 100.00    Types: Cigarettes    Quit date: 01/18/2018    Years since quitting: 4.8   Smokeless tobacco: Never   Tobacco comments:    QUIT!!!!  Vaping Use   Vaping Use: Never used  Substance and Sexual Activity   Alcohol use: Not Currently    Comment: occassional   Drug use: Yes    Types: Marijuana    Comment: none for last few days   Sexual activity: Not on file  Other Topics Concern   Not on file  Social History Narrative   Not on file   Social Determinants of Health   Financial Resource Strain: Not on file  Food Insecurity: Not on file  Transportation Needs: Not on file  Physical Activity:  Not on file  Stress: Not on file  Social Connections: Not on file  Intimate Partner Violence: Not on file    Review of Systems: See HPI, otherwise negative ROS  Physical Exam: BP (!) 163/103   Pulse 71   Temp 98.1 F (36.7 C) (Temporal)   Resp 18   Ht 5\' 5"  (1.651 m)   Wt 102.1 kg   SpO2 96%   BMI 37.44 kg/m  General:   Alert, cooperative in NAD Head:  Normocephalic and atraumatic. Respiratory:  Normal work of breathing. Cardiovascular:  RRR  Impression/Plan:  [name] is here for cataract surgery.  Risks, benefits, limitations, and alternatives regarding cataract surgery have been reviewed with the patient.  Questions have been answered.  All parties agreeable.   Willey Blade, MD  11/20/2022, 9:51 AM

## 2022-11-21 ENCOUNTER — Encounter: Payer: Self-pay | Admitting: Ophthalmology

## 2022-12-12 ENCOUNTER — Ambulatory Visit (HOSPITAL_COMMUNITY): Payer: Medicare Other | Admitting: Licensed Clinical Social Worker

## 2022-12-12 DIAGNOSIS — F3181 Bipolar II disorder: Secondary | ICD-10-CM | POA: Diagnosis not present

## 2022-12-12 NOTE — Progress Notes (Unsigned)
Virtual Visit via Video Note  I connected with [name] on 12/12/22 at  2:00 PM EDT by a video enabled telemedicine application and verified that I am speaking with the correct person using two identifiers.  Location: Patient: home Provider: remote office [place], [place])   I discussed the limitations of evaluation and management by telemedicine and the availability of in person appointments. The patient expressed understanding and agreed to proceed.  I discussed the assessment and treatment plan with the patient. The patient was provided an opportunity to ask questions and all were answered. The patient agreed with the plan and demonstrated an understanding of the instructions.   The patient was advised to call back or seek an in-person evaluation if the symptoms worsen or if the condition fails to improve as anticipated.  I provided 60 minutes of non-face-to-face time during this encounter.   [name] R [name], LCSW   THERAPIST PROGRESS NOTE  Session Time: 1-2P  Participation Level: Active  Behavioral Response: CasualAlertAnxious and Depressed  Type of Therapy: Individual Therapy  Treatment Goals addressed: Learn and implement coping skills that result in a reduction of anxiety and worry, and improve daily functioning per pt report 3 out of 5 sessions documented    ProgressTowards Goals: Progressing  Interventions: CBT, Motivational Interviewing, Solution Focused, and Other: trauma focused  Summary: [name] is a 72 y.o. female who presents with ongoing symptoms related to bipolar disorder symptoms. Pt reports overall mood is stable and that she is managing situational stressors well.   Weight loss--down 4 lbs in one week. Drinking water, NOOM diet.   Money concerns  Cateract surgery.   Driving to [place].   New friend from high school. Coffee with him every week. Social engagement.   Meals on wheels.   Friend coming from first of July to help clean to I  will come home to a clean house.   Always have something to look forard to  Sleep good; energy level good.   Spine injection--worked well. [place]   Making lists of things to do--doing adderall and I am productive. The adderall used to make me power thgouth the pain. Getting things done.   Doing well.   [place] to [place] in July[place]  [place]    Continued recommendations are as follows: self care behaviors, positive social engagements, focusing on overall work/home/life balance, and focusing on positive physical and emotional wellness.   Suicidal/Homicidal: No  Therapist Response: Pt is continuing to apply interventions learned in session into daily life situations. Pt is currently on track to meet goals utilizing interventions mentioned above. Personal growth and progress noted. Treatment to continue as indicated.   Plan: Return again in 4 weeks.  Diagnosis:  No diagnosis found.  Collaboration of Care: Other Pt to follow up with Annie Sable for medication management of symptoms.   Patient/Guardian was advised Release of Information must be obtained prior to any record release in order to collaborate their care with an outside provider. Patient/Guardian was advised if they have not already done so to contact the registration department to sign all necessary forms in order for [place] to release information regarding their care.   Consent: Patient/Guardian gives verbal consent for treatment and assignment of benefits for services provided during this visit. Patient/Guardian expressed understanding and agreed to proceed.   Ernest Haber Mindie Rawdon, LCSW 12/12/2022

## 2023-01-17 ENCOUNTER — Ambulatory Visit (HOSPITAL_COMMUNITY): Payer: Medicare Other | Admitting: Licensed Clinical Social Worker

## 2023-01-17 ENCOUNTER — Encounter (HOSPITAL_COMMUNITY): Payer: Self-pay

## 2023-01-17 DIAGNOSIS — Z91199 Patient's noncompliance with other medical treatment and regimen due to unspecified reason: Secondary | ICD-10-CM

## 2023-01-17 NOTE — Progress Notes (Signed)
LCSW counselor attempted to connect with patient for scheduled appointment via MyChart video text request x 2 and email request with no response.   Attempt 1: Text and email: 11:06a  Attempt 2: Text and email: 11:11a  Video session was closed at : 11:21a  Per [facility] policy, after multiple attempts to reach pt unsuccessfully at appointed time--visit will be coded as no show

## 2023-02-05 ENCOUNTER — Ambulatory Visit: Payer: Medicare Other | Admitting: Certified Registered"

## 2023-02-05 ENCOUNTER — Ambulatory Visit
Admission: RE | Admit: 2023-02-05 | Discharge: 2023-02-05 | Disposition: A | Discharge status: Home / Self Care | Payer: Medicare Other | Attending: Gastroenterology | Admitting: Gastroenterology

## 2023-02-05 ENCOUNTER — Encounter: Payer: Self-pay | Admitting: *Deleted

## 2023-02-05 ENCOUNTER — Encounter
Admission: RE | Disposition: A | Discharge status: Home / Self Care | Payer: Self-pay | Source: Home / Self Care | Attending: Gastroenterology

## 2023-02-05 ENCOUNTER — Other Ambulatory Visit: Payer: Self-pay

## 2023-02-05 DIAGNOSIS — Z1211 Encounter for screening for malignant neoplasm of colon: Secondary | ICD-10-CM | POA: Diagnosis not present

## 2023-02-05 DIAGNOSIS — K640 First degree hemorrhoids: Secondary | ICD-10-CM | POA: Diagnosis not present

## 2023-02-05 DIAGNOSIS — Z83719 Family history of colon polyps, unspecified: Secondary | ICD-10-CM | POA: Insufficient documentation

## 2023-02-05 DIAGNOSIS — K5730 Diverticulosis of large intestine without perforation or abscess without bleeding: Secondary | ICD-10-CM | POA: Diagnosis not present

## 2023-02-05 HISTORY — PX: COLONOSCOPY WITH PROPOFOL: SHX5780

## 2023-02-05 SURGERY — COLONOSCOPY WITH PROPOFOL
Anesthesia: General

## 2023-02-05 MED ORDER — PROPOFOL 10 MG/ML IV BOLUS
INTRAVENOUS | Status: DC | PRN
  Administered 2023-02-05: 30 mg via INTRAVENOUS
  Administered 2023-02-05: 20 mg via INTRAVENOUS
  Administered 2023-02-05: 80 mg via INTRAVENOUS

## 2023-02-05 MED ORDER — SODIUM CHLORIDE 0.9 % IV SOLN: INTRAVENOUS | Status: DC

## 2023-02-05 MED ORDER — LIDOCAINE HCL (CARDIAC) PF 100 MG/5ML IV SOSY
PREFILLED_SYRINGE | INTRAVENOUS | Status: DC | PRN
  Administered 2023-02-05: 100 mg via INTRAVENOUS

## 2023-02-05 MED ORDER — PROPOFOL 500 MG/50ML IV EMUL
INTRAVENOUS | Status: DC | PRN
  Administered 2023-02-05: 150 ug/kg/min via INTRAVENOUS

## 2023-02-05 NOTE — Transfer of Care (Signed)
Immediate Anesthesia Transfer of Care Note  Patient: [name]  Procedure(s) Performed: COLONOSCOPY WITH PROPOFOL  Patient Location: PACU  Anesthesia Type:General  Level of Consciousness: sedated  Airway & Oxygen Therapy: Patient Spontanous Breathing  Post-op Assessment: Report given to RN and Post -op Vital signs reviewed and stable  Post vital signs: Reviewed and stable  Last Vitals:  Vitals Value Taken Time  BP 121/76 02/05/23 1420  Temp 96.4 02/05/23 1420  Pulse 69 02/05/23 1420  Resp 21 02/05/23 1420  SpO2 100 % 02/05/23 1420  Vitals shown include unfiled device data.  Last Pain:  Vitals:   02/05/23 1314  TempSrc: Temporal  PainSc: 0-No pain         Complications: No notable events documented.

## 2023-02-05 NOTE — H&P (Signed)
Outpatient short stay form Pre-procedure 02/05/2023  Regis Bill, MD  Primary Physician: Rolm Gala, MD  Reason for visit:  Family history of polyps  History of present illness:    72 y/o lady with history of obesity, hypertension, fibromyalgia, and bipolar here for colonoscopy for family history of polyps in her father. Last colonoscopy in 2018 was unremarkable. History of sleeve gastrectomy and appendectomy. No blood thinners. No family history of GI malignancies.    Current Facility-Administered Medications:    0.9 %  sodium chloride infusion, , Intravenous, Continuous, , Rossie Muskrat, MD, Last Rate: 20 mL/hr at 02/05/23 1326, New Bag at 02/05/23 1326  Facility-Administered Medications Ordered in Other Encounters:    meloxicam (MOBIC) tablet 15 mg, 15 mg, Oral, Once, Deeann Saint, MD  Medications Prior to Admission  Medication Sig Dispense Refill Last Dose   alendronate (FOSAMAX) 70 MG tablet Take 70 mg by mouth once a week. Take with a full glass of water on an empty stomach.   Past Week   amLODipine (NORVASC) 10 MG tablet Take 5 mg by mouth daily.   02/05/2023   amphetamine-dextroamphetamine (ADDERALL) 15 MG tablet Take 15 mg by mouth daily.   02/04/2023   Calcium-Vitamin D 600-5 MG-MCG TABS Take by mouth daily.   Past Week   cholecalciferol (VITAMIN D) 1000 units tablet Take 2,000 Units by mouth daily.    Past Week   Dextran 70-Hypromellose (ARTIFICIAL TEARS) 0.1-0.3 % SOLN Apply to eye.   02/04/2023   DULoxetine (CYMBALTA) 60 MG capsule Take 60 mg by mouth at bedtime.    02/05/2023   gabapentin (NEURONTIN) 100 MG capsule Take 200 mg by mouth 3 (three) times daily.   02/05/2023   lisinopril (ZESTRIL) 10 MG tablet Take 10 mg by mouth daily.   02/05/2023   lovastatin (MEVACOR) 20 MG tablet Take 40 mg by mouth at bedtime.   02/04/2023   meloxicam (MOBIC) 7.5 MG tablet Take 15 mg by mouth daily as needed for pain.   Past Week   moxifloxacin (VIGAMOX) 0.5 % ophthalmic solution 1  drop 4 (four) times daily.   02/05/2023   Multiple Vitamins-Minerals (PRESERVISION AREDS 2 PO) Take 1 capsule by mouth 2 (two) times daily.   Past Week   Omega-3 Fatty Acids (FISH OIL) 1000 MG CAPS Take 1,000 mg by mouth at bedtime.   Past Week   omeprazole (PRILOSEC) 40 MG capsule Take 40 mg by mouth daily.   02/04/2023   Bee Pollen 580 MG CAPS Take by mouth daily. (Patient not taking: Reported on 11/06/2022)      Difluprednate (DUREZOL) 0.05 % EMUL Apply to eye.      Fluticasone Propionate (FLONASE NA) Place into the nose.      lamoTRIgine (LAMICTAL) 200 MG tablet Take 200 mg by mouth 2 (two) times daily. (Patient not taking: Reported on 02/05/2023)   Not Taking   Misc Natural Products (GLUCOS-CHONDROIT-MSM COMPLEX) TABS Take 1 tablet by mouth 2 (two) times daily. (Patient not taking: Reported on 10/31/2022)        Allergies  Allergen Reactions   Other Other (See Comments)    Eggplant - throat itches    Effexor [Venlafaxine] Hives   Wellbutrin [Bupropion] Hives     Past Medical History:  Diagnosis Date   Anginal pain (HCC) 2019   stable but patient denies.   Arthritis    Bipolar disorder (HCC)    Cancer (HCC) 12/2017   precancerous spot burnt off of face recently  Chronic kidney disease 2012   mrsa in kidneys caused coma and a trip to the icu   Depression    Diverticulosis    Dysrhythmia 12/2017   1st degree AV block. followed by dr. Gwen Pounds   Fibromyalgia    Heart murmur 2019   followed by dr. Gwen Pounds   History of kidney infection    History of MRSA infection 2011   kidney   Hypercholesterolemia    Hypertension    Leg wound, right 12/2017   draining sore . provided with antibiotics   S/P arthroscopy of right shoulder 11/2010   Spinal stenosis of lumbosacral region    Wears hearing aid in both ears     Review of systems:  Otherwise negative.    Physical Exam  Gen: Alert, oriented. Appears stated age.  HEENT: PERRLA. Lungs: No respiratory distress CV: RRR Abd:  soft, benign, no masses Ext: No edema    Planned procedures: Proceed with colonoscopy. The patient understands the nature of the planned procedure, indications, risks, alternatives and potential complications including but not limited to bleeding, infection, perforation, damage to internal organs and possible oversedation/side effects from anesthesia. The patient agrees and gives consent to proceed.  Please refer to procedure notes for findings, recommendations and patient disposition/instructions.     Regis Bill, MD [facility]

## 2023-02-05 NOTE — Anesthesia Postprocedure Evaluation (Signed)
Anesthesia Post Note  Patient: [name]  Procedure(s) Performed: COLONOSCOPY WITH PROPOFOL  Patient location during evaluation: PACU Anesthesia Type: General Level of consciousness: awake and alert, oriented and patient cooperative Pain management: pain level controlled Vital Signs Assessment: post-procedure vital signs reviewed and stable Respiratory status: spontaneous breathing, nonlabored ventilation and respiratory function stable Cardiovascular status: blood pressure returned to baseline and stable Postop Assessment: adequate PO intake Anesthetic complications: no   No notable events documented.   Last Vitals:  Vitals:   02/05/23 1419 02/05/23 1439  BP: 121/76 (!) 159/99  Pulse: 71   Resp: 20   Temp: 36.8 C   SpO2: 100%     Last Pain:  Vitals:   02/05/23 1439  TempSrc:   PainSc: 0-No pain                 Reed Breech

## 2023-02-05 NOTE — Op Note (Signed)
[facility] Gastroenterology Patient Name: [name] Procedure Date: 02/05/2023 1:45 PM MRN: [id number] Account #: [id number] Date of Birth: 03-24-1951 Admit Type: Outpatient Age: 72 Room: [facility] ENDO ROOM 3 Gender: Female Note Status: Finalized Instrument Name: Nelda Marseille [id number] Procedure:             Colonoscopy Indications:           Colon cancer screening in patient at increased risk:                         Family history of 1st-degree relative with colon polyps Providers:             Eather Colas MD, MD Referring MD:          Eather Colas MD, MD (Referring MD), Letitia Caul MD, MD (Referring MD) Medicines:             Monitored Anesthesia Care Complications:         No immediate complications. Procedure:             Pre-Anesthesia Assessment:                        - Prior to the procedure, a History and Physical was                         performed, and patient medications and allergies were                         reviewed. The patient is competent. The risks and                         benefits of the procedure and the sedation options and                         risks were discussed with the patient. All questions                         were answered and informed consent was obtained.                         Patient identification and proposed procedure were                         verified by the physician, the nurse, the                         anesthesiologist, the anesthetist and the technician                         in the endoscopy suite. Mental Status Examination:                         alert and oriented. Airway Examination: normal                         oropharyngeal airway and neck mobility. Respiratory  Examination: clear to auscultation. CV Examination:                         normal. Prophylactic Antibiotics: The patient does not                         require prophylactic  antibiotics. Prior                         Anticoagulants: The patient has taken no anticoagulant                         or antiplatelet agents. ASA Grade Assessment: II - A                         patient with mild systemic disease. After reviewing                         the risks and benefits, the patient was deemed in                         satisfactory condition to undergo the procedure. The                         anesthesia plan was to use monitored anesthesia care                         (MAC). Immediately prior to administration of                         medications, the patient was re-assessed for adequacy                         to receive sedatives. The heart rate, respiratory                         rate, oxygen saturations, blood pressure, adequacy of                         pulmonary ventilation, and response to care were                         monitored throughout the procedure. The physical                         status of the patient was re-assessed after the                         procedure.                        After obtaining informed consent, the colonoscope was                         passed under direct vision. Throughout the procedure,                         the patient's blood pressure, pulse, and oxygen  saturations were monitored continuously. The                         Colonoscope was introduced through the anus and                         advanced to the the cecum, identified by appendiceal                         orifice and ileocecal valve. The colonoscopy was                         performed without difficulty. The patient tolerated                         the procedure well. The quality of the bowel                         preparation was adequate to identify polyps. The                         ileocecal valve, appendiceal orifice, and rectum were                         photographed. Findings:      The perianal and  digital rectal examinations were normal.      Multiple small-mouthed diverticula were found in the sigmoid colon and       descending colon.      Internal hemorrhoids were found during retroflexion. The hemorrhoids       were Grade I (internal hemorrhoids that do not prolapse).      The exam was otherwise without abnormality on direct and retroflexion       views. Impression:            - Diverticulosis in the sigmoid colon and in the                         descending colon.                        - Internal hemorrhoids.                        - The examination was otherwise normal on direct and                         retroflexion views.                        - No specimens collected. Recommendation:        - Discharge patient to home.                        - Resume previous diet.                        - Continue present medications.                        - Repeat colonoscopy is not recommended due to current  age (34 years or older) for screening purposes.                        - Return to referring physician as previously                         scheduled. Procedure Code(s):     --- Professional ---                        U9811, Colorectal cancer screening; colonoscopy on                         individual at high risk Diagnosis Code(s):     --- Professional ---                        Z83.71, Family history of colonic polyps                        K64.0, First degree hemorrhoids                        K57.30, Diverticulosis of large intestine without                         perforation or abscess without bleeding CPT copyright 2022 [employer]. All rights reserved. The codes documented in this report are preliminary and upon coder review may  be revised to meet current compliance requirements. Eather Colas MD, MD 02/05/2023 2:18:42 PM Number of Addenda: 0 Note Initiated On: 02/05/2023 1:45 PM Scope Withdrawal Time: 0 hours 8 minutes  22 seconds  Total Procedure Duration: 0 hours 15 minutes 32 seconds  Estimated Blood Loss:  Estimated blood loss: none.      [facility]

## 2023-02-05 NOTE — Anesthesia Preprocedure Evaluation (Signed)
Anesthesia Evaluation  Patient identified by MRN, date of birth, ID band Patient awake    Reviewed: Allergy & Precautions, NPO status , Patient's Chart, lab work & pertinent test results  History of Anesthesia Complications Negative for: history of anesthetic complications  Airway Mallampati: II   Neck ROM: Full    Dental no notable dental hx.    Pulmonary former smoker (quit 2019)   Pulmonary exam normal breath sounds clear to auscultation       Cardiovascular hypertension, Normal cardiovascular exam Rhythm:Regular Rate:Normal     Neuro/Psych  PSYCHIATRIC DISORDERS  Depression Bipolar Disorder   HOH    GI/Hepatic ,GERD  Medicated,,S/p gastric bypass   Endo/Other  Obesity   Renal/GU Renal disease (CKD)     Musculoskeletal  (+) Arthritis ,    Abdominal   Peds  Hematology negative hematology ROS (+)   Anesthesia Other Findings   Reproductive/Obstetrics                             Anesthesia Physical Anesthesia Plan  ASA: 2  Anesthesia Plan: General   Post-op Pain Management:    Induction: Intravenous  PONV Risk Score and Plan: 3 and Propofol infusion, TIVA and Treatment may vary due to age or medical condition  Airway Management Planned: Natural Airway  Additional Equipment:   Intra-op Plan:   Post-operative Plan:   Informed Consent: I have reviewed the patients History and Physical, chart, labs and discussed the procedure including the risks, benefits and alternatives for the proposed anesthesia with the patient or authorized representative who has indicated his/her understanding and acceptance.       Plan Discussed with: CRNA  Anesthesia Plan Comments: (LMA/GETA backup discussed.  Patient consented for risks of anesthesia including but not limited to:  - adverse reactions to medications - damage to eyes, teeth, lips or other oral mucosa - nerve damage due to  positioning  - sore throat or hoarseness - damage to heart, brain, nerves, lungs, other parts of body or loss of life  Informed patient about role of CRNA in peri- and intra-operative care.  Patient voiced understanding.)       Anesthesia Quick Evaluation

## 2023-02-05 NOTE — Interval H&P Note (Signed)
History and Physical Interval Note:  02/05/2023 1:53 PM  [name]  has presented today for surgery, with the diagnosis of Z83.719 (ICD-10-CM) - Family history of polyps in the colon.  The various methods of treatment have been discussed with the patient and family. After consideration of risks, benefits and other options for treatment, the patient has consented to  Procedure(s): COLONOSCOPY WITH PROPOFOL (N/A) as a surgical intervention.  The patient's history has been reviewed, patient examined, no change in status, stable for surgery.  I have reviewed the patient's chart and labs.  Questions were answered to the patient's satisfaction.     Regis Bill  Ok to proceed with colonoscopy

## 2023-02-06 ENCOUNTER — Encounter: Payer: Self-pay | Admitting: Gastroenterology

## 2023-02-28 ENCOUNTER — Ambulatory Visit (HOSPITAL_COMMUNITY): Payer: Medicare Other | Admitting: Licensed Clinical Social Worker

## 2023-04-10 ENCOUNTER — Emergency Department
Admission: EM | Admit: 2023-04-10 | Discharge: 2023-04-11 | Disposition: A | Discharge status: Other Acute Inpatient Hospital | Payer: Medicare Other | Attending: Emergency Medicine | Admitting: Emergency Medicine

## 2023-04-10 DIAGNOSIS — T8131XA Disruption of external operation (surgical) wound, not elsewhere classified, initial encounter: Secondary | ICD-10-CM | POA: Insufficient documentation

## 2023-04-10 DIAGNOSIS — T8130XA Disruption of wound, unspecified, initial encounter: Secondary | ICD-10-CM

## 2023-04-10 DIAGNOSIS — Y829 Unspecified medical devices associated with adverse incidents: Secondary | ICD-10-CM | POA: Diagnosis not present

## 2023-04-10 NOTE — ED Provider Notes (Signed)
[facility] Provider Note    Event Date/Time   First MD Initiated Contact with Patient 04/10/23 2335     (approximate)   History   Post-op Problem   HPI  [name] is a 72 y.o. female with history of spinal stenosis who comes in with concerns for wound dehiscence.  I reviewed the admission note from 04/03/2023 where patient was admitted and underwent a procedure for her spinal stenosis by Dr. Manson Passey.  She has a lower abdominal wound that she reports around 10 PM she noticed that it was opened up.  She states that she does not remember do anything that could have opened it up.  She states that she denies this ever happening previously.  Denies any fever.  Patient was given fentanyl on route.  She denies any significant pain from the area prior to the fentanyl being given.     Physical Exam   Triage Vital Signs: ED Triage Vitals  Encounter Vitals Group     BP 04/10/23 2341 124/75     Systolic BP Percentile --      Diastolic BP Percentile --      Pulse Rate 04/10/23 2341 66     Resp 04/10/23 2341 19     Temp 04/10/23 2341 98.3 F (36.8 C)     Temp Source 04/10/23 2341 Oral     SpO2 04/10/23 2341 99 %     Weight 04/10/23 2343 240 lb (108.9 kg)     Height 04/10/23 2343 5\' 4"  (1.626 m)     Head Circumference --      Peak Flow --      Pain Score 04/10/23 2341 0     Pain Loc --      Pain Education --      Exclude from Growth Chart --     Most recent vital signs: Vitals:   04/10/23 2341  BP: 124/75  Pulse: 66  Resp: 19  Temp: 98.3 F (36.8 C)  SpO2: 99%     General: Awake, no distress.  CV:  Good peripheral perfusion.  Resp:  Normal effort.  Abd:  No distention.  Other:  No significant redness around the wound edges however she does have dehiscence of the wound.  See picture for further evaluation.  There is no drainage or pus from the wound.  She does have old bruising noted on her abdomen.   ED Results / Procedures / Treatments    Labs (all labs ordered are listed, but only abnormal results are displayed) Labs Reviewed  CBC WITH DIFFERENTIAL/PLATELET  BASIC METABOLIC PANEL     PROCEDURES:  Critical Care performed: No  Procedures   MEDICATIONS ORDERED IN ED: Medications - No data to display   IMPRESSION / MDM / ASSESSMENT AND PLAN / ED COURSE  I reviewed the triage vital signs and the nursing notes.   Patient's presentation is most consistent with acute presentation with potential threat to life or bodily function.   Patient comes in with wound dehiscence status post surgery at [facility]  Will attempt to get a hold of their orthopedic team there.  Patient is not septic and is otherwise well-appearing.    The patient is on the cardiac monitor to evaluate for evidence of arrhythmia and/or significant heart rate changes.      FINAL CLINICAL IMPRESSION(S) / ED DIAGNOSES   Final diagnoses:  None     Rx / DC Orders   ED Discharge Orders  None        Note:  This document was prepared using Dragon voice recognition software and may include unintentional dictation errors.

## 2023-04-10 NOTE — ED Triage Notes (Addendum)
Pt arrived via [facility] from [facility] where pt has resided since 10/4 after having spinal surgery @ [facility] on 10/01. Tonight pt noted dampness to lower abdomen at incision site and discovered Eviscerated bowel out of approx 3in opening to lower left abdomen. EDP at bedside for further evaluation. Pt is A&O x4.   Noted Surgery on 04/03/2023:  Oblique lateral interbody fusion lumbar; arthrodesis pre-sacral interbody technique; insertion interbody biomechanical device w/ instrumentation; percutaneous spinal fusion; arthrodesis posterior spine; instrumentation posterior spine 7 to 12 vertebral segments; insertion interbody biomechanical device w/ instrumentation; and oblique lateral interbody fusion lumbar.

## 2023-04-11 ENCOUNTER — Other Ambulatory Visit: Payer: Self-pay

## 2023-04-11 DIAGNOSIS — Y829 Unspecified medical devices associated with adverse incidents: Secondary | ICD-10-CM | POA: Diagnosis not present

## 2023-04-11 DIAGNOSIS — T8131XA Disruption of external operation (surgical) wound, not elsewhere classified, initial encounter: Secondary | ICD-10-CM | POA: Diagnosis present

## 2023-04-11 LAB — BASIC METABOLIC PANEL
Anion gap: 11 (ref 5–15)
BUN: 17 mg/dL (ref 8–23)
CO2: 24 mmol/L (ref 22–32)
Calcium: 8 mg/dL — ABNORMAL LOW (ref 8.9–10.3)
Chloride: 101 mmol/L (ref 98–111)
Creatinine, Ser: 0.77 mg/dL (ref 0.44–1.00)
GFR, Estimated: >60 mL/min (ref >60)
Glucose, Bld: 93 mg/dL (ref 70–99)
Potassium: 4 mmol/L (ref 3.5–5.1)
Sodium: 136 mmol/L (ref 135–145)

## 2023-04-11 LAB — CBC WITH DIFFERENTIAL/PLATELET
Abs Immature Granulocytes: 0.08 10*3/uL — ABNORMAL HIGH (ref 0.00–0.07)
Basophils Absolute: 0 10*3/uL (ref 0.0–0.1)
Basophils Relative: 0 %
Eosinophils Absolute: 0 10*3/uL (ref 0.0–0.5)
Eosinophils Relative: 0 %
HCT: 30.9 % — ABNORMAL LOW (ref 36.0–46.0)
Hemoglobin: 10 g/dL — ABNORMAL LOW (ref 12.0–15.0)
Immature Granulocytes: 2 %
Lymphocytes Relative: 40 %
Lymphs Abs: 1.9 10*3/uL (ref 0.7–4.0)
MCH: 30.9 pg (ref 26.0–34.0)
MCHC: 32.4 g/dL (ref 30.0–36.0)
MCV: 95.4 fL (ref 80.0–100.0)
Monocytes Absolute: 0.6 10*3/uL (ref 0.1–1.0)
Monocytes Relative: 13 %
Neutro Abs: 2.1 10*3/uL (ref 1.7–7.7)
Neutrophils Relative %: 45 %
Platelets: 215 10*3/uL (ref 150–400)
RBC: 3.24 MIL/uL — ABNORMAL LOW (ref 3.87–5.11)
RDW: 13 % (ref 11.5–15.5)
WBC: 4.8 10*3/uL (ref 4.0–10.5)
nRBC: 0 % (ref 0.0–0.2)

## 2023-04-11 MED ORDER — FENTANYL CITRATE PF 50 MCG/ML IJ SOSY
50.0000 ug | PREFILLED_SYRINGE | Freq: Once | INTRAMUSCULAR | Status: AC
  Administered 2023-04-11: 50 ug via INTRAVENOUS
  Filled 2023-04-11: qty 1

## 2023-04-11 MED ORDER — ONDANSETRON HCL 4 MG/2ML IJ SOLN
4.0000 mg | Freq: Once | INTRAMUSCULAR | Status: AC
  Administered 2023-04-11: 4 mg via INTRAVENOUS
  Filled 2023-04-11: qty 2

## 2023-04-11 NOTE — ED Notes (Signed)
@   0230 Spoke with Tammy from [place], No trucks available at the moment, Tammy will call back when there is an available truck

## 2023-04-11 NOTE — ED Notes (Signed)
..  EMTALA: REQUIRED DOCUMENTATION COMPLETED AND REVIEWED BY WRITER PRIOR TO PT TRANSFER MD REASSESSMENT EMTALA RN SECTION TRANSFER E-SIGN VS WITHIN REQUIRED TIME

## 2023-04-11 NOTE — ED Notes (Signed)
[facility] called and is in route/ will arrive in 10 minutes

## 2023-04-11 NOTE — ED Notes (Signed)
Called [facility]/ spoke with Rep Selena Batten @23 :56 for post-op consult.

## 2023-04-11 NOTE — ED Notes (Signed)
Received a call from Selena Batten @0202  with bed assignment patient accepted ED to ED @ [facility] [address] [place]. [address]/ Call report to [phone] Accepting attending Gery Pray For transportation assistance call [employer] [phone].

## 2023-04-11 NOTE — ED Notes (Signed)
@   20 Called [facility] spoke to [place] for assistance with transport/ Per Tammy Sours no trucks available at the moment pt will be added to list for transport after 7am.
